# Patient Record
Sex: Female | Born: 1970 | Race: White | Hispanic: No | Smoking: Never smoker
Health system: Southern US, Community
[De-identification: ages and names within clinical notes are randomized; demographics above are authoritative.]

## PROBLEM LIST (undated history)

## (undated) DIAGNOSIS — R519 Headache, unspecified: Secondary | ICD-10-CM

## (undated) DIAGNOSIS — I7300 Raynaud's syndrome without gangrene: Secondary | ICD-10-CM

## (undated) DIAGNOSIS — G43909 Migraine, unspecified, not intractable, without status migrainosus: Secondary | ICD-10-CM

## (undated) DIAGNOSIS — M353 Polymyalgia rheumatica: Secondary | ICD-10-CM

## (undated) DIAGNOSIS — G43709 Chronic migraine without aura, not intractable, without status migrainosus: Secondary | ICD-10-CM

## (undated) DIAGNOSIS — K219 Gastro-esophageal reflux disease without esophagitis: Secondary | ICD-10-CM

## (undated) DIAGNOSIS — I890 Lymphedema, not elsewhere classified: Secondary | ICD-10-CM

## (undated) DIAGNOSIS — R51 Headache: Secondary | ICD-10-CM

## (undated) DIAGNOSIS — K589 Irritable bowel syndrome without diarrhea: Secondary | ICD-10-CM

## (undated) DIAGNOSIS — C4490 Unspecified malignant neoplasm of skin, unspecified: Secondary | ICD-10-CM

## (undated) DIAGNOSIS — Z8659 Personal history of other mental and behavioral disorders: Secondary | ICD-10-CM

## (undated) DIAGNOSIS — J3089 Other allergic rhinitis: Secondary | ICD-10-CM

## (undated) DIAGNOSIS — F32A Depression, unspecified: Secondary | ICD-10-CM

## (undated) DIAGNOSIS — T8859XA Other complications of anesthesia, initial encounter: Secondary | ICD-10-CM

## (undated) DIAGNOSIS — N1831 Chronic kidney disease, stage 3a: Secondary | ICD-10-CM

## (undated) DIAGNOSIS — I10 Essential (primary) hypertension: Secondary | ICD-10-CM

## (undated) HISTORY — PX: NASAL SEPTUM SURGERY: SHX37

## (undated) HISTORY — PX: ARTHOSCOPIC ROTAOR CUFF REPAIR: SHX5002

## (undated) HISTORY — DX: Headache: R51

## (undated) HISTORY — DX: Headache, unspecified: R51.9

---

## 2007-02-01 HISTORY — PX: BREAST REDUCTION SURGERY: SHX8

## 2014-01-28 LAB — HM PAP SMEAR: HM PAP: NORMAL

## 2014-09-17 LAB — HM MAMMOGRAPHY

## 2015-08-07 ENCOUNTER — Other Ambulatory Visit: Payer: Self-pay | Admitting: Neurology

## 2015-08-07 ENCOUNTER — Ambulatory Visit (INDEPENDENT_AMBULATORY_CARE_PROVIDER_SITE_OTHER): Payer: Managed Care, Other (non HMO) | Admitting: Neurology

## 2015-08-07 ENCOUNTER — Encounter: Payer: Self-pay | Admitting: Neurology

## 2015-08-07 VITALS — BP 108/78 | HR 80 | Ht 63.0 in | Wt 166.2 lb

## 2015-08-07 DIAGNOSIS — G43009 Migraine without aura, not intractable, without status migrainosus: Secondary | ICD-10-CM | POA: Diagnosis not present

## 2015-08-07 DIAGNOSIS — G43909 Migraine, unspecified, not intractable, without status migrainosus: Secondary | ICD-10-CM | POA: Insufficient documentation

## 2015-08-07 DIAGNOSIS — Z8249 Family history of ischemic heart disease and other diseases of the circulatory system: Secondary | ICD-10-CM | POA: Diagnosis not present

## 2015-08-07 MED ORDER — TOPIRAMATE 100 MG PO TABS: 100.0000 mg | ORAL_TABLET | Freq: Two times a day (BID) | ORAL | Status: DC

## 2015-08-07 MED ORDER — ZOLMITRIPTAN 5 MG PO TABS: 5.0000 mg | ORAL_TABLET | ORAL | Status: DC | PRN

## 2015-08-07 NOTE — Patient Instructions (Signed)
I had a long discussion with the patient and her sister regarding her migraine headaches and discussed headache triggers and need to avoid them. She was advised to keep a strict headache diary. Continue to use Zomig 5 mg for symptomatic relief and to increase Topamax to 100 mg in the morning and 200 mg at night for headache prophylaxis. I discussed side effects with the patient and advised her to call me if needed. Check MRI scan of the brain and MRA of the brain given her family history of brain aneurysms. She was also advised to do regular neck stretching exercises and to increase participation in activities for stress laxation like regular exercise, meditation and yoga. She was advised to return for follow-up in 2 months or call earlier if necessary. Migraine Headache A migraine headache is an intense, throbbing pain on one or both sides of your head. A migraine can last for 30 minutes to several hours. CAUSES  The exact cause of a migraine headache is not always known. However, a migraine may be caused when nerves in the brain become irritated and release chemicals that cause inflammation. This causes pain. Certain things may also trigger migraines, such as:  Alcohol.  Smoking.  Stress.  Menstruation.  Aged cheeses.  Foods or drinks that contain nitrates, glutamate, aspartame, or tyramine.  Lack of sleep.  Chocolate.  Caffeine.  Hunger.  Physical exertion.  Fatigue.  Medicines used to treat chest pain (nitroglycerine), birth control pills, estrogen, and some blood pressure medicines. SIGNS AND SYMPTOMS  Pain on one or both sides of your head.  Pulsating or throbbing pain.  Severe pain that prevents daily activities.  Pain that is aggravated by any physical activity.  Nausea, vomiting, or both.  Dizziness.  Pain with exposure to bright lights, loud noises, or activity.  General sensitivity to bright lights, loud noises, or smells. Before you get a migraine, you may  get warning signs that a migraine is coming (aura). An aura may include:  Seeing flashing lights.  Seeing bright spots, halos, or zigzag lines.  Having tunnel vision or blurred vision.  Having feelings of numbness or tingling.  Having trouble talking.  Having muscle weakness. DIAGNOSIS  A migraine headache is often diagnosed based on:  Symptoms.  Physical exam.  A CT scan or MRI of your head. These imaging tests cannot diagnose migraines, but they can help rule out other causes of headaches. TREATMENT Medicines may be given for pain and nausea. Medicines can also be given to help prevent recurrent migraines.  HOME CARE INSTRUCTIONS  Only take over-the-counter or prescription medicines for pain or discomfort as directed by your health care provider. The use of long-term narcotics is not recommended.  Lie down in a dark, quiet room when you have a migraine.  Keep a journal to find out what may trigger your migraine headaches. For example, write down:  What you eat and drink.  How much sleep you get.  Any change to your diet or medicines.  Limit alcohol consumption.  Quit smoking if you smoke.  Get 7-9 hours of sleep, or as recommended by your health care provider.  Limit stress.  Keep lights dim if bright lights bother you and make your migraines worse. SEEK IMMEDIATE MEDICAL CARE IF:   Your migraine becomes severe.  You have a fever.  You have a stiff neck.  You have vision loss.  You have muscular weakness or loss of muscle control.  You start losing your balance or have  trouble walking.  You feel faint or pass out.  You have severe symptoms that are different from your first symptoms. MAKE SURE YOU:   Understand these instructions.  Will watch your condition.  Will get help right away if you are not doing well or get worse.   This information is not intended to replace advice given to you by your health care provider. Make sure you discuss any  questions you have with your health care provider.   Document Released: 01/17/2005 Document Revised: 02/07/2014 Document Reviewed: 09/24/2012 Elsevier Interactive Patient Education [employer].

## 2015-08-07 NOTE — Progress Notes (Signed)
[facility] [address] [place]. [place] [address] [phone]       OFFICE CONSULT NOTE  Maria. Maria Mejia Date of Birth:  [birth year] Medical Record Number:  [id number]   Referring [id number] [place], [place], [place]  Reason for Referral:  migraines HPI: Maria Mejia is a 23 year Caucasian lady who's been having migraine headaches off and on since the last 20-30 years. She recently moved to [facility] last November from [place], [place]. She was being seen by her primary care physician mostly for migraines. She describes the headaches as being stereotypic starting with pressure behind the right eye and spreading to involve the whole head and occasionally even going to involve the back of the neck. Headaches are moderate to severe in intensity. If she takes Zomig quickly the headache can be controlled but may return the next day. The headache can last up to 24-48 hours and is a complaint bad nausea light and sound sensitivity. Headache triggers are identified as change in barometric pressure, artificial sweeteners and peanut oil. She also reports tightness and pain in the back of the neck often. She previously tried Imitrex which did not work and Maxalt was working but her insurance has been covering Zomig ODT which seems to work quite well. She has been on Topamax 100 mg twice daily for several years for headache prophylaxis. The current headache frequency is a couple of times a week. She has never been on Inderal, Elavil of Botox for headache prophylaxis. She has seen a neurologist only once in 2007. She does not remember having had any brain MRI done even though she does have a family history of her mother dying from ruptured brain aneurysm. There is family history of migraine in her mother and maternal uncle. The last couple weeks she's also complaining of her eyes jumping subjectively when she is spending a lot of time on the computers. She denies any double vision, blurred vision or  loss of vision. She also complains of some intermittent pressure in his right ear off and on as well as some transient positional dizziness when she looks down. She does go to a chiropractor once a month and finds relief in her neck tightness.  ROS:   14 system review of systems is positive for  blurred vision, headache, dizziness, eye movements, spinning sensation, ringing in the ears and all other systems negative PMH:  Past Medical History  Diagnosis Date  . Headache     Social History:  Social History   Social History  . Marital Status: Divorced    Spouse Name: N/A  . Number of Children: N/A  . Years of Education: N/A   Occupational History  . Not on file.   Social History Main Topics  . Smoking status: Never Smoker   . Smokeless tobacco: Not on file  . Alcohol Use: 0.6 oz/week    1 Shots of liquor per week     Comment: occcasioally  . Drug Use: Not on file  . Sexual Activity: Not on file   Other Topics Concern  . Not on file   Social History Narrative  . No narrative on file    Medications:   No current outpatient prescriptions on file prior to visit.   No current facility-administered medications on file prior to visit.    Allergies:   Allergies  Allergen Reactions  . Augmentin [Amoxicillin-Pot Clavulanate]     Upset stomach  . Erythromycin Other (See Comments)    Upset stomach  .  Omnicef [Cefdinir]     Upset stomach   . Zithromax [Azithromycin]     Physical Exam General: well developed, well nourished middle aged caucasian lady, seated, in no evident distress Head: head normocephalic and atraumatic.   Neck: supple with no carotid or supraclavicular bruits Cardiovascular: regular rate and rhythm, no murmurs Musculoskeletal: no deformity Skin:  no rash/petichiae Vascular:  Normal pulses all extremities  Neurologic Exam Mental Status: Awake and fully alert. Oriented to place and time. Recent and remote memory intact. Attention span, concentration  and fund of knowledge appropriate. Mood and affect appropriate.  Cranial Nerves: Fundoscopic exam reveals sharp disc margins. Pupils equal, briskly reactive to light. Extraocular movements full without nystagmus. Visual fields full to confrontation. Hearing intact. Facial sensation intact. Face, tongue, palate moves normally and symmetrically.  Motor: Normal bulk and tone. Normal strength in all tested extremity muscles. Sensory.: intact to touch , pinprick , position and vibratory sensation.  Coordination: Rapid alternating movements normal in all extremities. Finger-to-nose and heel-to-shin performed accurately bilaterally. Gait and Station: Arises from chair without difficulty. Stance is normal. Gait demonstrates normal stride length and balance . Able to heel, toe and tandem walk without difficulty.  Reflexes: 1+ and symmetric. Toes downgoing.  Headache Impact test score 65     ASSESSMENT: 70 year Caucasian lady with long-standing history of migraine headaches and family history of intracranial ruptured brain aneurysm    PLAN: I had a long discussion with the patient and her sister regarding her migraine headaches and discussed headache triggers and need to avoid them. She was advised to keep a strict headache diary. Continue to use Zomig 5 mg for symptomatic relief and to increase Topamax to 100 mg in the morning and 200 mg at night for headache prophylaxis. I discussed side effects with the patient and advised her to call me if needed. Check MRI scan of the brain and MRA of the brain given her family history of brain aneurysms. She was also advised to do regular neck stretching exercises and to increase participation in activities for stress laxation like regular exercise, meditation and yoga. She was advised to return for follow-up in 2 months or call earlier if necessary. Greater than 50% time during this 45 minute consultation was spent in counseling and coordination of care about migraine,  prevention and treatment Antony Contras, MD  [facility] [address] [place], [place] [address]  Phone [phone] Fax [phone] Note: This document was prepared with digital dictation and possible smart phrase technology. Any transcriptional errors that result from this process are unintentional.

## 2015-08-18 ENCOUNTER — Other Ambulatory Visit: Payer: Self-pay | Admitting: Neurology

## 2015-08-20 ENCOUNTER — Telehealth: Payer: Self-pay | Admitting: Neurology

## 2015-08-20 ENCOUNTER — Other Ambulatory Visit: Payer: Self-pay

## 2015-08-20 ENCOUNTER — Ambulatory Visit
Admission: RE | Admit: 2015-08-20 | Discharge: 2015-08-20 | Disposition: A | Discharge status: Home / Self Care | Payer: Managed Care, Other (non HMO) | Source: Ambulatory Visit | Attending: Neurology | Admitting: Neurology

## 2015-08-20 DIAGNOSIS — G43009 Migraine without aura, not intractable, without status migrainosus: Secondary | ICD-10-CM | POA: Diagnosis not present

## 2015-08-20 DIAGNOSIS — Z8249 Family history of ischemic heart disease and other diseases of the circulatory system: Secondary | ICD-10-CM

## 2015-08-20 MED ORDER — GADOBENATE DIMEGLUMINE 529 MG/ML IV SOLN: 15.0000 mL | Freq: Once | INTRAVENOUS | Status: DC | PRN

## 2015-08-20 NOTE — Telephone Encounter (Signed)
Patient is scheduled today for MRI and MRA. [facility] has denied the MRA and [facility] would like to know if you would like for the MRA to be canceled or if you would like to conduct a Peer to Peer before the patient's apt this afternoon. Please let me know how to proceed.

## 2015-08-21 ENCOUNTER — Other Ambulatory Visit: Payer: Self-pay | Admitting: Neurology

## 2015-08-21 DIAGNOSIS — I671 Cerebral aneurysm, nonruptured: Secondary | ICD-10-CM

## 2015-08-21 NOTE — Telephone Encounter (Signed)
Message sent to Dr. Sethi. 

## 2015-08-21 NOTE — Telephone Encounter (Signed)
Cancel mra

## 2015-09-10 ENCOUNTER — Encounter: Payer: Self-pay | Admitting: Primary Care

## 2015-09-10 ENCOUNTER — Other Ambulatory Visit: Payer: Self-pay | Admitting: Primary Care

## 2015-09-10 ENCOUNTER — Ambulatory Visit (INDEPENDENT_AMBULATORY_CARE_PROVIDER_SITE_OTHER): Payer: Managed Care, Other (non HMO) | Admitting: Primary Care

## 2015-09-10 DIAGNOSIS — F329 Major depressive disorder, single episode, unspecified: Secondary | ICD-10-CM

## 2015-09-10 DIAGNOSIS — H938X3 Other specified disorders of ear, bilateral: Secondary | ICD-10-CM

## 2015-09-10 DIAGNOSIS — Z8249 Family history of ischemic heart disease and other diseases of the circulatory system: Secondary | ICD-10-CM | POA: Diagnosis not present

## 2015-09-10 DIAGNOSIS — F32A Depression, unspecified: Secondary | ICD-10-CM | POA: Insufficient documentation

## 2015-09-10 DIAGNOSIS — F418 Other specified anxiety disorders: Secondary | ICD-10-CM | POA: Diagnosis not present

## 2015-09-10 DIAGNOSIS — F419 Anxiety disorder, unspecified: Principal | ICD-10-CM

## 2015-09-10 DIAGNOSIS — H938X9 Other specified disorders of ear, unspecified ear: Secondary | ICD-10-CM | POA: Insufficient documentation

## 2015-09-10 DIAGNOSIS — G43009 Migraine without aura, not intractable, without status migrainosus: Secondary | ICD-10-CM

## 2015-09-10 MED ORDER — BUPROPION HCL ER (XL) 300 MG PO TB24: 300.0000 mg | ORAL_TABLET | Freq: Every day | ORAL | 2 refills | Status: DC

## 2015-09-10 NOTE — Progress Notes (Signed)
Pre visit review using our clinic review tool, if applicable. No additional management support is needed unless otherwise documented below in the visit note. 

## 2015-09-10 NOTE — Assessment & Plan Note (Addendum)
Ongoing for years and currently managed by neurology. Currently taking Topamax 100 mg twice daily and so made ODT tablets as needed. Recent MRI slightly abnormal, CT scan pending. Family history of brain aneurysm in mother.

## 2015-09-10 NOTE — Patient Instructions (Addendum)
I recommend moving forward with the CT scan of your brain for further evaluation of aneurysm.  Start a daily antihistamine such as Claritin, Zyrtec, or Allegra. Take this once daily for 4 weeks.  Try using Flonase (fluticasone) nasal spray. Instill 2 sprays in each nostril once daily for ear pressure/fullness. Do this for at least 4 weeks.  Please call me if no improvement in ear/eye pressure.  Please call me with the strength of your Wellbutrin. Please also note if it says SR or XL on the end.  Please schedule a physical with me before the end of the year. You may also schedule a lab only appointment 3-4 days prior. We will discuss your lab results in detail during your physical.  It was a pleasure to meet you today! Please don't hesitate to call me with any questions. Welcome to [facility]!

## 2015-09-10 NOTE — Assessment & Plan Note (Signed)
Located to bilateral ears, also popping sensation behind both eyes. History of sinusitis and seasonal allergies. Temporary improvement with over-the-counter decongestant.  Will have her start daily antihistamine for [redacted] weeks along with Flonase for 4 weeks and report back if no improvement. HENT exam today unremarkable.

## 2015-09-10 NOTE — Progress Notes (Signed)
Subjective:    Patient ID: Maria Mejia, female    DOB: 05-02-1970, 45 y.o.   MRN: [id number]  HPI  Maria Mejia is a 45 year old female who presents today to establish care and discuss the problems mentioned below. Will obtain old records.  1) Migraines: Currently averaging 2-3 migraines weekly. Family history of brain aneurysm in her mother. Currently managed on Topamax 100 mg in the morning and 100 mg at night; and Zomig 5 mg ODT PRN. She is currently following with neurology and recently completed MRI which was mostly clear with a few non specific right frontal white matter hypreintensities. It was recommended she undergo a CT scan for further evaluation.   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 She would look at the computer and feel as though her eyes were moving back and forth. She's had complete eye exam recently which was unremarkable. She feels as though her eyes are popping forward now.   2) Ear Fullness: Several months ago experienced dizziness, ear pressure, and shooting pain. The pressure originated to her right ear and has since moved to her left ear. She's tried a decongestant for 1 week with improvement/resolve temporarily. Monday this week she's started feeling "off" as she's experiencing right ear pressure and the "popping" out of her eyes. She's not currently using OTC treatment for her symptoms at this time. History of recurrent sinusitis and sinus surgery in the 1990's.   3) Generalized Anxiety Disorder: Currently managed on Bupropion XL 150 mg shortly after she moved to this area in November 2016. She had recently dealt with  the passing of her mother and moving to a new place. She was placed on this medication for crying, anxiety, and depression. Over the past 2-3 months she's noticed tearing up at TV shows, watching her dog, and increased anxiety at work. She feels like the  medication is helpful overall but doesn't feel as though it's entirely effective.  Review of Systems  HENT: Negative for congestion and sinus pressure.        Ear fullness  Eyes:       Pressure behind eyes  Respiratory: Negative for shortness of breath.   Cardiovascular: Negative for chest pain.  Allergic/Immunologic: Positive for environmental allergies.  Neurological: Positive for headaches. Negative for dizziness.  Psychiatric/Behavioral: The patient is nervous/anxious.        See history of present illness       Past Medical History:  Diagnosis Date  . Headache      Social History   Social History  . Marital status: Divorced    Spouse name: N/A  . Number of children: N/A  . Years of education: N/A   Occupational History  . Not on file.   Social History Main Topics  . Smoking status: Never Smoker  . Smokeless tobacco: Not on file  . Alcohol use 0.6 oz/week    1 Shots of liquor per week     Comment: occcasioally  . Drug use: Unknown  . Sexual activity: Not on file   Other Topics Concern  . Not on file   Social History Narrative  . No narrative on file    No past surgical history on file.  Family History  Problem Relation Age of Onset  . Migraines Mother   . Stroke Mother   . Migraines Paternal Uncle   . Alzheimer's disease Maternal Grandfather   .  Alzheimer's disease Paternal Grandfather     Allergies  Allergen Reactions  . Augmentin [Amoxicillin-Pot Clavulanate]     Upset stomach  . Erythromycin Other (See Comments)    Upset stomach  . Omnicef [Cefdinir]     Upset stomach   . Zithromax [Azithromycin]     Current Outpatient Prescriptions on File Prior to Visit  Medication Sig Dispense Refill  . Levonorgestrel-Ethinyl Estradiol (SEASONIQUE) 0.15-0.03 &0.01 MG tablet Take 1 tablet by mouth daily.    Marland Kitchen topiramate (TOPAMAX) 100 MG tablet Take 1 tablet (100 mg total) by mouth 2 (two) times daily. Take one tablet in morning and two at night  (Patient taking differently: Take 100 mg by mouth 2 (two) times daily. ) 90 tablet 3   No current facility-administered medications on file prior to visit.     BP 114/72   Pulse 80   Temp 98.6 F (37 C) (Oral)   Ht 5' 3.75" (1.619 m)   Wt 162 lb 12.8 oz (73.8 kg)   SpO2 98%   BMI 28.16 kg/m    Objective:   Physical Exam  Constitutional: She appears well-nourished.  HENT:  Right Ear: Tympanic membrane and ear canal normal.  Left Ear: Tympanic membrane and ear canal normal.  Nose: Right sinus exhibits no maxillary sinus tenderness and no frontal sinus tenderness. Left sinus exhibits no maxillary sinus tenderness and no frontal sinus tenderness.  Mouth/Throat: Oropharynx is clear and moist.  Eyes: Conjunctivae and EOM are normal. Pupils are equal, round, and reactive to light.  Neck: Neck supple.  Cardiovascular: Normal rate and regular rhythm.   Pulmonary/Chest: Effort normal and breath sounds normal. She has no wheezes. She has no rales.  Lymphadenopathy:    She has no cervical adenopathy.  Neurological: No cranial nerve deficit.  Skin: Skin is warm and dry.  Psychiatric: She has a normal mood and affect.          Assessment & Plan:

## 2015-09-10 NOTE — Assessment & Plan Note (Signed)
Diagnosed in November 2016 after moving to [place] from [place] and also after the passing of her mother. Currently managed on Wellbutrin but she is unsure of the dose. Her chart states 75 mg once daily, pharmacy has XL 150 mg. She will call back when she gets home to read off the strength on her current bottle. We'll likely need to increase the dose as symptoms are not well managed at this time. Denies SI/HI.

## 2015-09-10 NOTE — Assessment & Plan Note (Signed)
In mother. Recent MRI slightly abnormal, CT head pending.

## 2015-09-15 ENCOUNTER — Encounter: Payer: Self-pay | Admitting: Primary Care

## 2015-09-16 ENCOUNTER — Other Ambulatory Visit: Payer: Self-pay | Admitting: Primary Care

## 2015-09-16 DIAGNOSIS — F329 Major depressive disorder, single episode, unspecified: Secondary | ICD-10-CM

## 2015-09-16 DIAGNOSIS — F419 Anxiety disorder, unspecified: Principal | ICD-10-CM

## 2015-09-16 MED ORDER — BUPROPION HCL ER (XL) 150 MG PO TB24: 150.0000 mg | ORAL_TABLET | Freq: Every day | ORAL | 3 refills | Status: DC

## 2015-09-18 ENCOUNTER — Ambulatory Visit
Admission: RE | Admit: 2015-09-18 | Discharge: 2015-09-18 | Disposition: A | Discharge status: Home / Self Care | Payer: Managed Care, Other (non HMO) | Source: Ambulatory Visit | Attending: Neurology | Admitting: Neurology

## 2015-09-18 DIAGNOSIS — I671 Cerebral aneurysm, nonruptured: Secondary | ICD-10-CM

## 2015-09-18 MED ORDER — IOPAMIDOL (ISOVUE-370) INJECTION 76%
80.0000 mL | Freq: Once | INTRAVENOUS | Status: AC | PRN
  Administered 2015-09-18: 80 mL via INTRAVENOUS

## 2015-09-29 ENCOUNTER — Other Ambulatory Visit: Payer: Self-pay | Admitting: Primary Care

## 2015-09-29 DIAGNOSIS — Z0000 Encounter for general adult medical examination without abnormal findings: Secondary | ICD-10-CM

## 2015-10-07 ENCOUNTER — Other Ambulatory Visit (INDEPENDENT_AMBULATORY_CARE_PROVIDER_SITE_OTHER): Payer: Managed Care, Other (non HMO)

## 2015-10-07 DIAGNOSIS — Z0000 Encounter for general adult medical examination without abnormal findings: Secondary | ICD-10-CM

## 2015-10-07 DIAGNOSIS — R7989 Other specified abnormal findings of blood chemistry: Secondary | ICD-10-CM

## 2015-10-08 LAB — COMPREHENSIVE METABOLIC PANEL
ALBUMIN: 3.9 g/dL (ref 3.5–5.2)
ALT: 18 U/L (ref 0–35)
AST: 18 U/L (ref 0–37)
Alkaline Phosphatase: 44 U/L (ref 39–117)
BUN: 24 mg/dL — AB (ref 6–23)
CHLORIDE: 108 meq/L (ref 96–112)
CO2: 25 meq/L (ref 19–32)
CREATININE: 1.02 mg/dL (ref 0.40–1.20)
Calcium: 8.6 mg/dL (ref 8.4–10.5)
GFR: 62.35 mL/min (ref >60.00)
Glucose, Bld: 71 mg/dL (ref 70–99)
POTASSIUM: 3.7 meq/L (ref 3.5–5.1)
SODIUM: 140 meq/L (ref 135–145)
Total Bilirubin: 0.2 mg/dL (ref 0.2–1.2)
Total Protein: 7.1 g/dL (ref 6.0–8.3)

## 2015-10-08 LAB — LIPID PANEL
CHOL/HDL RATIO: 4
CHOLESTEROL: 189 mg/dL (ref 0–200)
HDL: 49 mg/dL (ref >39.00)
NonHDL: 140.17
Triglycerides: 217 mg/dL — ABNORMAL HIGH (ref 0.0–149.0)
VLDL: 43.4 mg/dL — AB (ref 0.0–40.0)

## 2015-10-08 LAB — VITAMIN D 25 HYDROXY (VIT D DEFICIENCY, FRACTURES): VITD: 43.16 ng/mL (ref 30.00–100.00)

## 2015-10-08 LAB — HEMOGLOBIN A1C: HEMOGLOBIN A1C: 5.4 % (ref 4.6–6.5)

## 2015-10-08 LAB — LDL CHOLESTEROL, DIRECT: Direct LDL: 117 mg/dL

## 2015-10-12 ENCOUNTER — Ambulatory Visit (INDEPENDENT_AMBULATORY_CARE_PROVIDER_SITE_OTHER): Payer: Managed Care, Other (non HMO) | Admitting: Primary Care

## 2015-10-12 ENCOUNTER — Encounter: Payer: Self-pay | Admitting: Primary Care

## 2015-10-12 ENCOUNTER — Ambulatory Visit: Payer: Managed Care, Other (non HMO) | Admitting: Neurology

## 2015-10-12 VITALS — BP 122/80 | HR 82 | Temp 98.4°F | Ht 64.0 in | Wt 171.8 lb

## 2015-10-12 DIAGNOSIS — H938X3 Other specified disorders of ear, bilateral: Secondary | ICD-10-CM | POA: Diagnosis not present

## 2015-10-12 DIAGNOSIS — Z8249 Family history of ischemic heart disease and other diseases of the circulatory system: Secondary | ICD-10-CM | POA: Diagnosis not present

## 2015-10-12 DIAGNOSIS — M25552 Pain in left hip: Secondary | ICD-10-CM

## 2015-10-12 DIAGNOSIS — Z23 Encounter for immunization: Secondary | ICD-10-CM | POA: Diagnosis not present

## 2015-10-12 DIAGNOSIS — F418 Other specified anxiety disorders: Secondary | ICD-10-CM | POA: Diagnosis not present

## 2015-10-12 DIAGNOSIS — R6889 Other general symptoms and signs: Secondary | ICD-10-CM

## 2015-10-12 DIAGNOSIS — F419 Anxiety disorder, unspecified: Secondary | ICD-10-CM

## 2015-10-12 DIAGNOSIS — Z0001 Encounter for general adult medical examination with abnormal findings: Secondary | ICD-10-CM | POA: Diagnosis not present

## 2015-10-12 DIAGNOSIS — M25559 Pain in unspecified hip: Secondary | ICD-10-CM | POA: Insufficient documentation

## 2015-10-12 DIAGNOSIS — F32A Depression, unspecified: Secondary | ICD-10-CM

## 2015-10-12 DIAGNOSIS — F329 Major depressive disorder, single episode, unspecified: Secondary | ICD-10-CM

## 2015-10-12 DIAGNOSIS — E781 Pure hyperglyceridemia: Secondary | ICD-10-CM

## 2015-10-12 NOTE — Assessment & Plan Note (Signed)
Improved with Flonase and antihistamine, continue same.

## 2015-10-12 NOTE — Assessment & Plan Note (Signed)
Stable on Wellbutrin XL 150 mg, continue same.

## 2015-10-12 NOTE — Assessment & Plan Note (Signed)
Tetanus due, provided today. Declines flu. Pap and mammogram due, follows with GYN. Discussed the importance of a healthy diet and regular exercise in order for weight loss and to reduce risk of other medical diseases. Exam unremarkable, except for decreased ROM to left hip. Labs with elevated triglycerides. Follow up in 1 year for repeat physical.

## 2015-10-12 NOTE — Progress Notes (Signed)
Pre visit review using our clinic review tool, if applicable. No additional management support is needed unless otherwise documented below in the visit note. 

## 2015-10-12 NOTE — Progress Notes (Signed)
Subjective:    Patient ID: Maria Mejia, female    DOB: [birth year], 46 y.o.   MRN: [id number]  HPI  Maria Mejia is a 45 year old female who presents today for complete physical.  Immunizations: -Tetanus: Unsure.  -Influenza: Declines   Diet: She endorses a fair diet. Breakfast: Protein shake Lunch: Sandwich Dinner: Meat, sandwich, pizza, frozen food Snacks: Occasionally, granola bar, yogurt Desserts: Occasionally Beverages: Water  Exercise: She exercises 3-4 times weekly. Eye exam: Completed in 2017. Dental exam: Completes annually. Pap Smear: Completed in 2015, normal.  Mammogram: Completed 1 year ago, due.   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 Over the past 3 months she's noticed symptoms of "catching", difficulty with ROM (roation), achy pain to the left hip. Her pain is worse with sedentary activity. She will take ibuprofen as needed. She trailed physical therapy in 2011 for her right labral tear without improvement. She did undergo joint injections with improvement. She is interested in seeing Sports Medicine for further evaluation. Denies numbness/tinlging, weakness.   Review of Systems  Constitutional: Negative for unexpected weight change.  HENT: Negative for rhinorrhea.   Respiratory: Negative for cough and shortness of breath.   Cardiovascular: Negative for chest pain.  Gastrointestinal: Negative for constipation and diarrhea.  Genitourinary: Negative for difficulty urinating.       No period in several years.   Musculoskeletal: Negative for arthralgias and myalgias.  Skin: Negative for rash.  Allergic/Immunologic: Negative for environmental allergies.  Neurological: Negative for dizziness, numbness and headaches.  Psychiatric/Behavioral: Negative for suicidal ideas. The patient is nervous/anxious.        Overall stable on Wellbutrin XL 150 mg.       Past Medical History:  Diagnosis Date  . Headache      Social History   Social History  .  Marital status: Divorced    Spouse name: N/A  . Number of children: N/A  . Years of education: N/A   Occupational History  . Not on file.   Social History Main Topics  . Smoking status: Never Smoker  . Smokeless tobacco: Not on file  . Alcohol use 0.6 oz/week    1 Shots of liquor per week     Comment: occcasioally  . Drug use: Unknown  . Sexual activity: Not on file   Other Topics Concern  . Not on file   Social History Narrative  . No narrative on file    No past surgical history on file.  Family History  Problem Relation Age of Onset  . Migraines Mother   . Stroke Mother   . Migraines Paternal Uncle   . Alzheimer's disease Maternal Grandfather   . Alzheimer's disease Paternal Grandfather     Allergies  Allergen Reactions  . Augmentin [Amoxicillin-Pot Clavulanate]     Upset stomach  . Erythromycin Other (See Comments)    Upset stomach  . Omnicef [Cefdinir]     Upset stomach   . Zithromax [Azithromycin]     Current Outpatient Prescriptions on File Prior to Visit  Medication Sig Dispense Refill  . buPROPion (WELLBUTRIN XL) 150 MG 24 hr tablet Take 1 tablet (150 mg total) by mouth daily. 90 tablet 3  . Levonorgestrel-Ethinyl Estradiol (SEASONIQUE) 0.15-0.03 &0.01 MG tablet Take 1 tablet by mouth daily.    Marland Kitchen topiramate (TOPAMAX) 100 MG tablet Take 1 tablet (100 mg total) by mouth 2 (two) times daily. Take one tablet in morning and two at night (Patient taking differently:  Take 100 mg by mouth 2 (two) times daily. ) 90 tablet 3  . zolmitriptan (ZOMIG-ZMT) 5 MG disintegrating tablet Take 5 mg by mouth as needed for migraine.     No current facility-administered medications on file prior to visit.     BP 122/80   Pulse 82   Temp 98.4 F (36.9 C) (Oral)   Ht 5\' 4"  (1.626 m)   Wt 171 lb 12.8 oz (77.9 kg)   SpO2 94%   BMI 29.49 kg/m    Objective:   Physical Exam  Constitutional: She is oriented to person, place, and time. She appears well-nourished.    HENT:  Right Ear: Tympanic membrane and ear canal normal.  Left Ear: Tympanic membrane and ear canal normal.  Nose: Nose normal.  Mouth/Throat: Oropharynx is clear and moist.  Eyes: Conjunctivae and EOM are normal. Pupils are equal, round, and reactive to light.  Neck: Neck supple. No thyromegaly present.  Cardiovascular: Normal rate and regular rhythm.   No murmur heard. Pulmonary/Chest: Effort normal and breath sounds normal. She has no rales.  Abdominal: Soft. Bowel sounds are normal. There is no tenderness.  Musculoskeletal:       Left hip: She exhibits decreased range of motion. She exhibits normal strength, no tenderness and no deformity.  Lymphadenopathy:    She has no cervical adenopathy.  Neurological: She is alert and oriented to person, place, and time. She has normal reflexes. No cranial nerve deficit.  Skin: Skin is warm and dry. No rash noted.  Psychiatric: She has a normal mood and affect.          Assessment & Plan:

## 2015-10-12 NOTE — Addendum Note (Signed)
Addended by: Jacqualin Combes on: 10/12/2015 12:07 PM   Modules accepted: Orders

## 2015-10-12 NOTE — Assessment & Plan Note (Signed)
Triglycerides at 217. Overall fair diet, discussed changes. Continue exercise. Start Fish Oil 1000 mg once daily. Will continue to monitor.

## 2015-10-12 NOTE — Patient Instructions (Signed)
Your triglycerides are slightly too high. Start Fish Oil 1000 mg once daily to help lower these levels. We will continue to monitor these in the future.  You were provided with a tetanus vaccination which will cover you for 10 years.  Please notify me if you need a referral to OB/GYN.  Schedule an appointment with Dr. Lorelei Pont for further evaluation of your hip.  Increase consumption of vegetables, fruit, whole grains. Continue exercising.  Follow up in 1 year for repeat physical or sooner if needed.  It was a pleasure to see you today!

## 2015-10-12 NOTE — Assessment & Plan Note (Signed)
Recent CT angio unremarkable. Discussed results with patient.

## 2015-10-12 NOTE — Assessment & Plan Note (Signed)
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 Same symptoms now to left.  Discussed treatment options and she is interested in seeing Dr. Lorelei Pont and will schedule. No improvement with PT in the past, likely needs MRI.

## 2015-10-14 ENCOUNTER — Ambulatory Visit (INDEPENDENT_AMBULATORY_CARE_PROVIDER_SITE_OTHER)
Admission: RE | Admit: 2015-10-14 | Discharge: 2015-10-14 | Disposition: A | Discharge status: Home / Self Care | Payer: Managed Care, Other (non HMO) | Source: Ambulatory Visit | Attending: Family Medicine | Admitting: Family Medicine

## 2015-10-14 ENCOUNTER — Ambulatory Visit (INDEPENDENT_AMBULATORY_CARE_PROVIDER_SITE_OTHER): Payer: Managed Care, Other (non HMO) | Admitting: Family Medicine

## 2015-10-14 ENCOUNTER — Encounter: Payer: Self-pay | Admitting: Family Medicine

## 2015-10-14 VITALS — BP 110/72 | HR 88 | Temp 99.0°F | Ht 64.0 in | Wt 171.8 lb

## 2015-10-14 DIAGNOSIS — M25561 Pain in right knee: Secondary | ICD-10-CM

## 2015-10-14 DIAGNOSIS — M25552 Pain in left hip: Secondary | ICD-10-CM | POA: Diagnosis not present

## 2015-10-14 NOTE — Progress Notes (Signed)
Pre visit review using our clinic review tool, if applicable. No additional management support is needed unless otherwise documented below in the visit note. 

## 2015-10-14 NOTE — Progress Notes (Signed)
Dr. Frederico Hamman T. Shar Paez, MD, [facility] [place], [address] Phone: [phone] Fax: [phone]  10/14/2015  Patient: Maria Mejia, MRN: [id number], DOB: [birth year], 45 y.o.  Primary Physician:  Sheral Flow, NP   Chief Complaint  Patient presents with  . Hip Pain    ?Labrel Tear Left Hip  . Knee Pain    Right   Subjective:   Maria Mejia is a 45 y.o. very pleasant female patient who presents with the following:  Left hip labral tear: hip ??  R labral tear. Does not bother it too much. She has a confirmed right-sided labral tear in her hip that was treated conservatively  In [place] with physical therapy and an intra-articular injection.  She has had intra-articular injection of the hip 2. XR and MR arthrogram.  Small tear. Had a cortisone injection, last 2 years.    Left hip. Ongoing pain x 3 months.  Working out at [employer].  Will ache just from moving and in and out of her chair.  Cannot sit [place] style.  No trauma or dislocation, and primary pain comes with internal range of motion at the hip.  R knee hurt to straighten it and hurt some to walk on it and then felt a little bit better and felt like a bubble under her knee cap. Ongoing for about 24 hours.     Past Medical History, Surgical History, Social History, Family History, Problem List, Medications, and Allergies have been reviewed and updated if relevant.  Patient Active Problem List   Diagnosis Date Noted  . Encounter for routine adult medical exam with abnormal findings 10/12/2015  . Hypertriglyceridemia 10/12/2015  . Hip pain 10/12/2015  . Anxiety and depression 09/10/2015  . Ear fullness 09/10/2015  . Migraine 08/07/2015  . FH: brain aneurysm 08/07/2015    Past Medical History:  Diagnosis Date  . Headache     No past surgical history on file.  Social History   Social History  . Marital status: Divorced    Spouse name: N/A    . Number of children: N/A  . Years of education: N/A   Occupational History  . Not on file.   Social History Main Topics  . Smoking status: Never Smoker  . Smokeless tobacco: Never Used  . Alcohol use 0.6 oz/week    1 Shots of liquor per week     Comment: occcasioally  . Drug use: Unknown  . Sexual activity: Not on file   Other Topics Concern  . Not on file   Social History Narrative  . No narrative on file    Family History  Problem Relation Age of Onset  . Migraines Mother   . Stroke Mother   . Migraines Paternal Uncle   . Alzheimer's disease Maternal Grandfather   . Alzheimer's disease Paternal Grandfather     Allergies  Allergen Reactions  . Augmentin [Amoxicillin-Pot Clavulanate]     Upset stomach  . Erythromycin Other (See Comments)    Upset stomach  . Omnicef [Cefdinir]     Upset stomach   . Zithromax [Azithromycin]     Medication list reviewed and updated in full in [place].  GEN: No fevers, chills. Nontoxic. Primarily MSK c/o today. MSK: Detailed in the HPI GI: tolerating PO intake without difficulty Neuro: No numbness, parasthesias, or tingling associated. Otherwise the pertinent positives of the ROS are noted above.   Objective:   BP 110/72  Pulse 88   Temp 99 F (37.2 C) (Oral)   Ht 5\' 4"  (1.626 m)   Wt 171 lb 12 oz (77.9 kg)   BMI 29.48 kg/m     GEN: WDWN, NAD, Non-toxic, Alert & Oriented x 3 HEENT: Atraumatic, Normocephalic.  Ears and Nose: No external deformity. EXTR: No clubbing/cyanosis/edema NEURO: Normal gait.  PSYCH: Normally interactive. Conversant. Not depressed or anxious appearing.  Calm demeanor.   Knee:  R Gait: Normal heel toe pattern ROM: 0 - 125 Effusion: neg Echymosis or edema: none Patellar tendon NT Painful PLICA: neg Patellar grind: negative Medial and lateral patellar facet loading: negative medial and lateral joint lines: mild medial joint space pain Mcmurray's neg Flexion-pinch neg Varus  and valgus stress: stable Lachman: neg Ant and Post drawer: neg Hip abduction, IR, ER: WNL Hip flexion str: 5/5 Hip abd: 5/5 Quad: 5/5 VMO atrophy:No Hamstring concentric and eccentric: 5/5    HIP EXAM: SIDE: LEFT ROM: Abduction, Flexion, Internal and External range of motion: abduction is normal, and terminal abduction causes mild pain.  With the hip flexed to 90, terminal external range of motion is preserved, and  Internal range of motion causes pain.  There is increased range of motion with  Both abduction and internal range of motion  Done at the same time. Pain with terminal IROM and EROM: IROM GTB: NT SLR: NEG Knees: No effusion FABER: NT REVERSE FABER: NT, neg Piriformis: NT at direct palpation Str: flexion: 5/5 abduction: 5/5 adduction: 5/5 Strength testing non-tender   Radiology: X-rays: AP Bilateral Weight-bearing, Weightbearing Lateral, Sunrise views Indication: knee pain Findings:  No significant joint space loss.  There is no fracture or dislocation.  There is a  Mild lateral tilt to the patella on sunrise view. Electronically Signed  By: Owens Loffler, MD On: 10/15/2015 6:05 AM   Xray, hip series Indication: pain Findings: No occult fracture or dislocation. No signficant osteoarthritic changes.  Electronically Signed  By: Owens Loffler, MD On: 10/15/2015 6:06 AM    Assessment and Plan:   Left hip pain - Plan: DG HIP UNILAT WITH PELVIS 2-3 VIEWS LEFT, MR Hip Left W Contrast, DG FLUORO GUIDED NEEDLE PLC ASPIRATION/INJECTION LOC  Right knee pain - Plan: DG Knee AP/LAT W/Sunrise Right  >25 minutes spent in face to face time with patient, >50% spent in counselling or coordination of care   3 months of symptoms with failure to improve with traditional conservative care and management and normal hip x-rays.  Positive FADIR testing is suggestive of acetabular labral tear.  Obtain an MR arthrogram of the left hip to better evaluate for potential labral tear or other  derangement of the hip.  Findings will dictate potential further intervention including operative management versus iinterventional injection of the hip  And further conservative trial.  Reassured patient regarding her knee.  I would do nothing further  Short of modifying activities.  Follow-up: No Follow-up on file.  Orders Placed This Encounter  Procedures  . DG HIP UNILAT WITH PELVIS 2-3 VIEWS LEFT  . DG Knee AP/LAT W/Sunrise Right  . MR Hip Left W Contrast  . DG FLUORO GUIDED NEEDLE PLC ASPIRATION/INJECTION LOC    Signed,  Arnitra Sokoloski T. Prakash Kimberling, MD   Patient's Medications  New Prescriptions   No medications on file  Previous Medications   BUPROPION (WELLBUTRIN XL) 150 MG 24 HR TABLET    Take 1 tablet (150 mg total) by mouth daily.   LEVONORGESTREL-ETHINYL ESTRADIOL (SEASONIQUE) 0.15-0.03 &0.01 MG TABLET  Take 1 tablet by mouth daily.   TOPIRAMATE (TOPAMAX) 100 MG TABLET    Take 1 tablet (100 mg total) by mouth 2 (two) times daily. Take one tablet in morning and two at night   ZOLMITRIPTAN (ZOMIG-ZMT) 5 MG DISINTEGRATING TABLET    Take 5 mg by mouth as needed for migraine.  Modified Medications   No medications on file  Discontinued Medications   No medications on file

## 2015-10-14 NOTE — Patient Instructions (Signed)

## 2015-10-22 ENCOUNTER — Telehealth: Payer: Self-pay

## 2015-10-22 NOTE — Telephone Encounter (Signed)
I spoke to patient and she is aware of results.  

## 2015-10-22 NOTE — Telephone Encounter (Signed)
-----   Message from Garvin Fila, MD sent at 10/22/2015  4:13 PM EDT ----- Mitchell Heir inform the patient with CT angiogram of the brain was normal and shows no stenosis, aneurysms.

## 2015-10-29 ENCOUNTER — Ambulatory Visit
Admission: RE | Admit: 2015-10-29 | Discharge: 2015-10-29 | Disposition: A | Discharge status: Home / Self Care | Payer: Managed Care, Other (non HMO) | Source: Ambulatory Visit | Attending: Family Medicine | Admitting: Family Medicine

## 2015-10-29 DIAGNOSIS — M25552 Pain in left hip: Secondary | ICD-10-CM

## 2015-10-29 MED ORDER — IOPAMIDOL (ISOVUE-M 200) INJECTION 41%
10.0000 mL | Freq: Once | INTRAMUSCULAR | Status: AC
  Administered 2015-10-29: 10 mL via INTRA_ARTICULAR

## 2015-11-05 ENCOUNTER — Encounter: Payer: Self-pay | Admitting: Family Medicine

## 2015-12-08 ENCOUNTER — Encounter: Payer: Self-pay | Admitting: Primary Care

## 2016-03-30 ENCOUNTER — Other Ambulatory Visit: Payer: Self-pay | Admitting: Primary Care

## 2016-03-30 DIAGNOSIS — Z3041 Encounter for surveillance of contraceptive pills: Secondary | ICD-10-CM

## 2016-03-30 NOTE — Telephone Encounter (Signed)
Ok to refill? Electronically refill request for topiramate (TOPAMAX) 100 MG tablet. Last prescribed on 08/07/2015.  Levonorgestrel-Ethinyl Estradiol (SEASONIQUE) 0.15-0.03 &0.01. Medication have not been prescribed by [facility] Kraft.  Last seen on 10/12/2015.

## 2016-03-30 NOTE — Telephone Encounter (Signed)
Is she still following with neurology for her migraines? They have historically refilled the Topamax, just wondering.

## 2016-04-01 NOTE — Telephone Encounter (Signed)
Message left for patient to return my call.  

## 2016-04-04 NOTE — Telephone Encounter (Signed)
Noted. Please double check the instructions, there are two directions on her current Rx. Please call and notify I will send refills once we clarify how often she's taking.

## 2016-04-04 NOTE — Telephone Encounter (Signed)
Message left for patient to return my call.  Sent patient a MyChart message as well.

## 2016-04-04 NOTE — Telephone Encounter (Signed)
Pt returned your call, she will ck MyChart msg.

## 2016-04-05 ENCOUNTER — Other Ambulatory Visit: Payer: Self-pay | Admitting: Primary Care

## 2016-04-05 DIAGNOSIS — G43701 Chronic migraine without aura, not intractable, with status migrainosus: Secondary | ICD-10-CM

## 2016-04-05 MED ORDER — TOPIRAMATE 100 MG PO TABS: 100.0000 mg | ORAL_TABLET | Freq: Two times a day (BID) | ORAL | 1 refills | Status: DC

## 2016-04-05 NOTE — Telephone Encounter (Signed)
Patient stated that the neurologist had it as shown but it was too much for patient.  Patient is currently taking 1 tablet BID.

## 2016-04-05 NOTE — Telephone Encounter (Signed)
Noted, Rx sent to pharmacy,

## 2016-04-25 ENCOUNTER — Other Ambulatory Visit: Payer: Self-pay | Admitting: Primary Care

## 2016-04-25 DIAGNOSIS — G43701 Chronic migraine without aura, not intractable, with status migrainosus: Secondary | ICD-10-CM

## 2016-04-25 NOTE — Telephone Encounter (Signed)
Ok to refill? Electronically refill request for zolmitriptan (ZOMIG-ZMT) 5 MG disintegrating tablet. Medication have not been prescribed by [facility] Kraft. Last seen on 09/10/2015.

## 2016-06-09 ENCOUNTER — Encounter: Payer: Self-pay | Admitting: Primary Care

## 2016-06-09 ENCOUNTER — Ambulatory Visit (INDEPENDENT_AMBULATORY_CARE_PROVIDER_SITE_OTHER): Payer: Managed Care, Other (non HMO) | Admitting: Primary Care

## 2016-06-09 VITALS — BP 126/72 | HR 88 | Temp 98.9°F | Wt 175.5 lb

## 2016-06-09 DIAGNOSIS — R197 Diarrhea, unspecified: Secondary | ICD-10-CM | POA: Diagnosis not present

## 2016-06-09 DIAGNOSIS — G43701 Chronic migraine without aura, not intractable, with status migrainosus: Secondary | ICD-10-CM

## 2016-06-09 DIAGNOSIS — L659 Nonscarring hair loss, unspecified: Secondary | ICD-10-CM

## 2016-06-09 LAB — COMPREHENSIVE METABOLIC PANEL
ALBUMIN: 4.1 g/dL (ref 3.5–5.2)
ALK PHOS: 37 U/L — AB (ref 39–117)
ALT: 12 U/L (ref 0–35)
AST: 13 U/L (ref 0–37)
BILIRUBIN TOTAL: 0.3 mg/dL (ref 0.2–1.2)
BUN: 21 mg/dL (ref 6–23)
CALCIUM: 9.2 mg/dL (ref 8.4–10.5)
CO2: 24 mEq/L (ref 19–32)
CREATININE: 1.14 mg/dL (ref 0.40–1.20)
Chloride: 108 mEq/L (ref 96–112)
GFR: 54.68 mL/min — AB (ref >60.00)
Glucose, Bld: 86 mg/dL (ref 70–99)
Potassium: 4.3 mEq/L (ref 3.5–5.1)
Sodium: 138 mEq/L (ref 135–145)
TOTAL PROTEIN: 6.9 g/dL (ref 6.0–8.3)

## 2016-06-09 LAB — CBC WITH DIFFERENTIAL/PLATELET
BASOS ABS: 0.1 10*3/uL (ref 0.0–0.1)
Basophils Relative: 0.8 % (ref 0.0–3.0)
Eosinophils Absolute: 0.1 10*3/uL (ref 0.0–0.7)
Eosinophils Relative: 1.3 % (ref 0.0–5.0)
HEMATOCRIT: 42.7 % (ref 36.0–46.0)
HEMOGLOBIN: 14.4 g/dL (ref 12.0–15.0)
LYMPHS PCT: 34.1 % (ref 12.0–46.0)
Lymphs Abs: 2.5 10*3/uL (ref 0.7–4.0)
MCHC: 33.6 g/dL (ref 30.0–36.0)
MCV: 96.9 fl (ref 78.0–100.0)
MONOS PCT: 8.1 % (ref 3.0–12.0)
Monocytes Absolute: 0.6 10*3/uL (ref 0.1–1.0)
NEUTROS ABS: 4 10*3/uL (ref 1.4–7.7)
Neutrophils Relative %: 55.7 % (ref 43.0–77.0)
PLATELETS: 277 10*3/uL (ref 150.0–400.0)
RBC: 4.41 Mil/uL (ref 3.87–5.11)
RDW: 13 % (ref 11.5–15.5)
WBC: 7.2 10*3/uL (ref 4.0–10.5)

## 2016-06-09 LAB — TSH: TSH: 0.86 u[IU]/mL (ref 0.35–4.50)

## 2016-06-09 MED ORDER — DICYCLOMINE HCL 10 MG PO CAPS: ORAL_CAPSULE | ORAL | 0 refills | Status: DC

## 2016-06-09 NOTE — Assessment & Plan Note (Addendum)
Intermittent x 2-3 months. Sounds to be IBS etiology, but will check labs today to rule out any other cause.  Rx for dicyclomine provided to use PRN. Discussed stress reduction. Consider GI referral if no improvement. No alarm signs.

## 2016-06-09 NOTE — Progress Notes (Signed)
Pre visit review using our clinic review tool, if applicable. No additional management support is needed unless otherwise documented below in the visit note. 

## 2016-06-09 NOTE — Progress Notes (Signed)
Subjective:    Patient ID: Maria Mejia, female    DOB: [birth year], 46 y.o.   MRN: [id number]  HPI  Maria Mejia is a 46 year old female who presents today with multiple complaints.   1) Weight Gain: She's experiencing symptoms of weight gain and hair loss. She's very discouraged regarding her weight gain over the last 1-2 years. For the past 3-4 months she's not been exercising or strictly watching her diet, but denies over eating. She started working out at [employer] with a Physiological scientist over the past 5 weeks. She's also taking spin classes. She's discouraged as she's only lost 3-4 pounds in the past 4 weeks. She would like her thyroid tested to ensure this isn't the cause of her slow progress. Over the past 3-4 months she was very stressed with family illnesses and did suffer with depression, this has all improved. She has also noticed some hair loss since she's growing out her hair. She has no family history of thyroid disease.  Wt Readings from Last 3 Encounters:  06/09/16 175 lb 8 oz (79.6 kg)  10/14/15 171 lb 12 oz (77.9 kg)  10/12/15 171 lb 12.8 oz (77.9 kg)     2) Migraines: Evaluated by Neurology last year. Currently managed on Topamax 100 mg BID and Zomig PRN. She will experience six migraines monthly on average, sometimes more. She is running through her Zomig quickly. She is requesting an appeal to her insurance in order request more than 6 tablets monthly. Her first and only visit with the neurologist last year was very expensive.  3) Diarrhea: Intermittently present for the past 2 months. Occurs with gas, bloating. No relation to eating/drinking. She's not changed her diet. Denies nausea, vomiting, abdominal pain, rectal bleeding. This will occur once every 3-4 weeks and will last 3-4 hours. She'll take Imodium as needed with improvement.   Review of Systems  Constitutional: Negative for fever.  Eyes: Positive for photophobia.  Gastrointestinal: Positive for diarrhea. Negative for  abdominal pain, blood in stool, constipation, nausea and vomiting.  Skin:       Mild hair loss  Neurological: Positive for headaches.       Past Medical History:  Diagnosis Date  . Headache      Social History   Social History  . Marital status: Divorced    Spouse name: N/A  . Number of children: N/A  . Years of education: N/A   Occupational History  . Not on file.   Social History Main Topics  . Smoking status: Never Smoker  . Smokeless tobacco: Never Used  . Alcohol use 0.6 oz/week    1 Shots of liquor per week     Comment: occcasioally  . Drug use: Unknown  . Sexual activity: Not on file   Other Topics Concern  . Not on file   Social History Narrative  . No narrative on file    No past surgical history on file.  Family History  Problem Relation Age of Onset  . Migraines Mother   . Stroke Mother   . Migraines Paternal Uncle   . Alzheimer's disease Maternal Grandfather   . Alzheimer's disease Paternal Grandfather     Allergies  Allergen Reactions  . Augmentin [Amoxicillin-Pot Clavulanate]     Upset stomach  . Erythromycin Other (See Comments)    Upset stomach  . Omnicef [Cefdinir]     Upset stomach   . Zithromax [Azithromycin]     Current Outpatient Prescriptions on File  Prior to Visit  Medication Sig Dispense Refill  . buPROPion (WELLBUTRIN XL) 150 MG 24 hr tablet Take 1 tablet (150 mg total) by mouth daily. 90 tablet 3  . Levonorgestrel-Ethinyl Estradiol (AMETHIA,CAMRESE) 0.15-0.03 &0.01 MG tablet TAKE 1 TABLET BY MOUTH ONCE DAILY 91 tablet 1  . topiramate (TOPAMAX) 100 MG tablet Take 1 tablet (100 mg total) by mouth 2 (two) times daily. 180 tablet 1  . zolmitriptan (ZOMIG-ZMT) 5 MG disintegrating tablet Take 1 tablet by mouth at migraine onset. May repeat in 2 hours if migraine persists. Do not exceed 2 tablets in 24 hours. 9 tablet 4   No current facility-administered medications on file prior to visit.     BP 126/72   Pulse 88   Temp  98.9 F (37.2 C) (Oral)   Wt 175 lb 8 oz (79.6 kg)   SpO2 98%   BMI 30.12 kg/m    Objective:   Physical Exam  Constitutional: She appears well-nourished.  Eyes: EOM are normal. Pupils are equal, round, and reactive to light.  Neck: Neck supple.  Cardiovascular: Normal rate and regular rhythm.   Pulmonary/Chest: Effort normal and breath sounds normal.  Neurological: No cranial nerve deficit.  Skin: Skin is warm and dry.  Psychiatric: She has a normal mood and affect.          Assessment & Plan:  Weight Gain:  Likely from poor diet and lack of exercise from prior months. Commended her on her weight loss within 5 weeks, discussed that this was a positive thing. Will check TSH given hair loss, suspect to be normal.. Encouraged her to continue regular exercise, healthy diet, and stress reduction.  Sheral Flow, NP

## 2016-06-09 NOTE — Assessment & Plan Note (Signed)
Uncontrolled. Using Zomig sometimes 6 times monthly. Long discussion today regarding treatment options. She declines other preventative treatment options. Will work on appeal for Zomig, but discussed that she will need Neurology evaluation for prevention. Referral placed to neurology.

## 2016-06-09 NOTE — Patient Instructions (Addendum)
Complete lab work prior to leaving today. I will notify you of your results once received.   Stop by the front desk and speak with either Rosaria Ferries or Shirlean Mylar regarding your referral to Neurology. Discuss your insurance concerns.   You may take the dicyclomine 10 mg capsules three times daily as needed for diarrhea/bloating. Please update me if no improvement.  It was a pleasure to see you today!

## 2016-06-13 ENCOUNTER — Encounter: Payer: Self-pay | Admitting: Neurology

## 2016-06-22 ENCOUNTER — Ambulatory Visit: Payer: Managed Care, Other (non HMO) | Admitting: Primary Care

## 2016-06-28 ENCOUNTER — Other Ambulatory Visit: Payer: Self-pay | Admitting: Primary Care

## 2016-06-28 DIAGNOSIS — R197 Diarrhea, unspecified: Secondary | ICD-10-CM

## 2016-06-29 NOTE — Telephone Encounter (Signed)
Please call patient, any improvement in diarrhea since starting the dicyclomine? Does she need a refill?

## 2016-06-29 NOTE — Telephone Encounter (Signed)
Noted  

## 2016-06-29 NOTE — Telephone Encounter (Signed)
Ok to refill? Electronically refill request for dicyclomine (BENTYL) 10 MG capsule. Last prescribed on 06/09/2016. Last seen on 06/09/2016

## 2016-06-29 NOTE — Telephone Encounter (Signed)
Spoke to patient and was advised that she has only had to take one of the pills since the office visit. Patient stated that she only has an episode about once a month. Patient stated that she had a bubbly sensation in her stomach the other day and took one pill and did not have any diarrhea with that episode. Patient stated that she does not need a refill on the medication at this time and will let [facility] know when she does.

## 2016-07-07 ENCOUNTER — Other Ambulatory Visit: Payer: Self-pay | Admitting: Primary Care

## 2016-07-07 DIAGNOSIS — R197 Diarrhea, unspecified: Secondary | ICD-10-CM

## 2016-07-07 MED ORDER — DICYCLOMINE HCL 10 MG PO CAPS: ORAL_CAPSULE | ORAL | 0 refills | Status: DC

## 2016-07-12 ENCOUNTER — Telehealth: Payer: Self-pay | Admitting: Neurology

## 2016-07-12 NOTE — Telephone Encounter (Signed)
Please advise 

## 2016-07-12 NOTE — Telephone Encounter (Signed)
PT called and asked if she needed to come in to see Dr Tomi Likens on her scheduled July visit because she wanted to discuss botox and knows she will not qualify for botox and does not want to waste Dr Georgie Chard time and also it will be expensive, told her we could not advise but she wanted a message put through

## 2016-07-13 ENCOUNTER — Telehealth: Payer: Self-pay | Admitting: *Deleted

## 2016-07-13 NOTE — Telephone Encounter (Signed)
If there is something that she wishes to discuss with a neurologist regarding her migraines, then she should definitely keep the appointment.  A patient visit is never a waste of my time.  I am here to address her concerns and questions.

## 2016-07-13 NOTE — Telephone Encounter (Signed)
Patient's copay is too high.  She will get her refills from PCP.

## 2016-07-13 NOTE — Telephone Encounter (Signed)
Left message giving patient Dr. Georgie Chard message.

## 2016-07-31 ENCOUNTER — Encounter: Payer: Self-pay | Admitting: Primary Care

## 2016-07-31 DIAGNOSIS — G43701 Chronic migraine without aura, not intractable, with status migrainosus: Secondary | ICD-10-CM

## 2016-08-01 MED ORDER — ZOLMITRIPTAN 5 MG PO TBDP: ORAL_TABLET | ORAL | 2 refills | Status: DC

## 2016-08-04 NOTE — Telephone Encounter (Signed)
How many am I calling in??  #5 or #9??

## 2016-08-04 NOTE — Telephone Encounter (Signed)
Spoke with [employer].  Preferred quantity per 30 days is #6.  In order for the patient to get #9 a prior authorization will need to be done.  Do you want to do a PA or change Rx to quantity #6?

## 2016-08-04 NOTE — Telephone Encounter (Signed)
9 please! Thank you!

## 2016-08-04 NOTE — Telephone Encounter (Signed)
Donna, can you help?

## 2016-08-04 NOTE — Telephone Encounter (Signed)
Butch Penny, is this something you can do for Vallarie Mare since she and I are both out of the office? See the next My Chart message.  I appreciate it!

## 2016-08-04 NOTE — Telephone Encounter (Signed)
I'm fine with either. Please notify the patient of the situation and let her decide.

## 2016-08-05 NOTE — Telephone Encounter (Signed)
PA approved.  Patient notified via [place].

## 2016-08-05 NOTE — Telephone Encounter (Signed)
I have completed PA on CoverMyMeds.  Awaiting decision.

## 2016-08-05 NOTE — Telephone Encounter (Deleted)
Patient want PA.  Forms Requested.  This will have to wait for Kate's return to office on Monday to complete.  I think the problem is the patient use to be on regular Zomig 5 mg and then changed to an ODT tablets.  That is why insurance will approve only 6 vs 9.

## 2016-08-26 ENCOUNTER — Ambulatory Visit: Payer: Managed Care, Other (non HMO) | Admitting: Neurology

## 2016-09-09 ENCOUNTER — Other Ambulatory Visit: Payer: Self-pay | Admitting: Primary Care

## 2016-09-09 DIAGNOSIS — F329 Major depressive disorder, single episode, unspecified: Secondary | ICD-10-CM

## 2016-09-09 DIAGNOSIS — F419 Anxiety disorder, unspecified: Principal | ICD-10-CM

## 2016-10-07 ENCOUNTER — Other Ambulatory Visit: Payer: Self-pay | Admitting: Primary Care

## 2016-10-07 DIAGNOSIS — R197 Diarrhea, unspecified: Secondary | ICD-10-CM

## 2016-10-07 NOTE — Telephone Encounter (Signed)
Rx request for bentyl 10 mg Last refilled 07/07/2016 Last OV 06/09/2016 Next OV: none

## 2016-10-26 ENCOUNTER — Other Ambulatory Visit: Payer: Self-pay | Admitting: Primary Care

## 2016-10-26 DIAGNOSIS — Z3041 Encounter for surveillance of contraceptive pills: Secondary | ICD-10-CM

## 2016-11-22 ENCOUNTER — Other Ambulatory Visit: Payer: Self-pay | Admitting: Primary Care

## 2016-11-22 DIAGNOSIS — G43701 Chronic migraine without aura, not intractable, with status migrainosus: Secondary | ICD-10-CM

## 2016-12-08 ENCOUNTER — Other Ambulatory Visit: Payer: Self-pay | Admitting: Primary Care

## 2016-12-08 DIAGNOSIS — R197 Diarrhea, unspecified: Secondary | ICD-10-CM

## 2017-01-25 ENCOUNTER — Encounter: Payer: Self-pay | Admitting: Primary Care

## 2017-01-25 DIAGNOSIS — G43701 Chronic migraine without aura, not intractable, with status migrainosus: Secondary | ICD-10-CM

## 2017-01-25 DIAGNOSIS — Z1283 Encounter for screening for malignant neoplasm of skin: Secondary | ICD-10-CM

## 2017-01-25 DIAGNOSIS — Z3041 Encounter for surveillance of contraceptive pills: Secondary | ICD-10-CM

## 2017-01-25 MED ORDER — LEVONORGEST-ETH ESTRAD 91-DAY 0.15-0.03 &0.01 MG PO TABS: 1.0000 | ORAL_TABLET | Freq: Every day | ORAL | 0 refills | Status: DC

## 2017-02-01 MED ORDER — ZOLMITRIPTAN 5 MG PO TBDP: ORAL_TABLET | ORAL | 0 refills | Status: DC

## 2017-02-28 ENCOUNTER — Ambulatory Visit (INDEPENDENT_AMBULATORY_CARE_PROVIDER_SITE_OTHER): Payer: Managed Care, Other (non HMO) | Admitting: Primary Care

## 2017-02-28 ENCOUNTER — Encounter: Payer: Self-pay | Admitting: Primary Care

## 2017-02-28 VITALS — BP 124/74 | HR 79 | Temp 98.5°F | Ht 63.75 in | Wt 169.8 lb

## 2017-02-28 DIAGNOSIS — Z0001 Encounter for general adult medical examination with abnormal findings: Secondary | ICD-10-CM

## 2017-02-28 DIAGNOSIS — R197 Diarrhea, unspecified: Secondary | ICD-10-CM | POA: Diagnosis not present

## 2017-02-28 DIAGNOSIS — F32A Depression, unspecified: Secondary | ICD-10-CM

## 2017-02-28 DIAGNOSIS — F329 Major depressive disorder, single episode, unspecified: Secondary | ICD-10-CM

## 2017-02-28 DIAGNOSIS — G43009 Migraine without aura, not intractable, without status migrainosus: Secondary | ICD-10-CM

## 2017-02-28 DIAGNOSIS — F419 Anxiety disorder, unspecified: Secondary | ICD-10-CM | POA: Diagnosis not present

## 2017-02-28 DIAGNOSIS — E781 Pure hyperglyceridemia: Secondary | ICD-10-CM

## 2017-02-28 MED ORDER — METHYLPREDNISOLONE ACETATE 80 MG/ML IJ SUSP
80.0000 mg | Freq: Once | INTRAMUSCULAR | Status: AC
  Administered 2017-02-28: 80 mg via INTRAMUSCULAR

## 2017-02-28 NOTE — Assessment & Plan Note (Signed)
Repeat lipid panel pending. Recommended to continue regular exercise and start healthy diet.

## 2017-02-28 NOTE — Assessment & Plan Note (Signed)
Overall doing well on bupropion XL 150 mg, denies SI/HI. Continue same.

## 2017-02-28 NOTE — Patient Instructions (Signed)
Complete lab work at your GYN office if possible.  Continue exercising. You should be getting 150 minutes of moderate intensity exercise weekly.  Start working on [employer] as discussed.  You will be contacted regarding your [facility] to [facility].  Please let us know if you have not been contacted within one week.   Follow up in 1 year for your annual exam or sooner if needed.  It was a pleasure to see you today!   Preventive Care 40-64 Years, Female Preventive care refers to lifestyle choices and visits with your health care provider that can promote health and wellness. What does preventive care include?  A yearly physical exam. This is also called an annual well check.  Dental exams once or twice a year.  Routine eye exams. Ask your health care provider how often you should have your eyes checked.  Personal lifestyle choices, including: ? Daily care of your teeth and gums. ? Regular physical activity. ? Eating a healthy diet. ? Avoiding tobacco and drug use. ? Limiting alcohol use. ? Practicing safe sex. ? Taking low-dose aspirin daily starting at age 18. ? Taking vitamin and mineral supplements as recommended by your health care provider. What happens during an annual well check? The services and screenings done by your health care provider during your annual well check will depend on your age, overall health, lifestyle risk factors, and family history of disease. Counseling Your health care provider may ask you questions about your:  Alcohol use.  Tobacco use.  Drug use.  Emotional well-being.  Home and relationship well-being.  Sexual activity.  Eating habits.  Work and work Statistician.  Method of birth control.  Menstrual cycle.  Pregnancy history.  Screening You may have the following tests or measurements:  Height, weight, and BMI.  Blood pressure.  Lipid and cholesterol levels. These may be checked every 5 years, or more  frequently if you are over 56 years old.  Skin check.  Lung cancer screening. You may have this screening every year starting at age 24 if you have a 30-pack-year history of smoking and currently smoke or have quit within the past 15 years.  Fecal occult blood test (FOBT) of the stool. You may have this test every year starting at age 32.  Flexible sigmoidoscopy or colonoscopy. You may have a sigmoidoscopy every 5 years or a colonoscopy every 10 years starting at age 77.  Hepatitis C blood test.  Hepatitis B blood test.  Sexually transmitted disease (STD) testing.  Diabetes screening. This is done by checking your blood sugar (glucose) after you have not eaten for a while (fasting). You may have this done every 1-3 years.  Mammogram. This may be done every 1-2 years. Talk to your health care provider about when you should start having regular mammograms. This may depend on whether you have a family history of breast cancer.  BRCA-related cancer screening. This may be done if you have a family history of breast, ovarian, tubal, or peritoneal cancers.  Pelvic exam and Pap test. This may be done every 3 years starting at age 49. Starting at age 62, this may be done every 5 years if you have a Pap test in combination with an HPV test.  Bone density scan. This is done to screen for osteoporosis. You may have this scan if you are at high risk for osteoporosis.  Discuss your test results, treatment options, and if necessary, the need for more tests with your health  care provider. Vaccines Your health care provider may recommend certain vaccines, such as:  Influenza vaccine. This is recommended every year.  Tetanus, diphtheria, and acellular pertussis (Tdap, Td) vaccine. You may need a Td booster every 10 years.  Varicella vaccine. You may need this if you have not been vaccinated.  Zoster vaccine. You may need this after age 83.  Measles, mumps, and rubella (MMR) vaccine. You may need  at least one dose of MMR if you were born in 1957 or later. You may also need a second dose.  Pneumococcal 13-valent conjugate (PCV13) vaccine. You may need this if you have certain conditions and were not previously vaccinated.  Pneumococcal polysaccharide (PPSV23) vaccine. You may need one or two doses if you smoke cigarettes or if you have certain conditions.  Meningococcal vaccine. You may need this if you have certain conditions.  Hepatitis A vaccine. You may need this if you have certain conditions or if you travel or work in places where you may be exposed to hepatitis A.  Hepatitis B vaccine. You may need this if you have certain conditions or if you travel or work in places where you may be exposed to hepatitis B.  Haemophilus influenzae type b (Hib) vaccine. You may need this if you have certain conditions.  Talk to your health care provider about which screenings and vaccines you need and how often you need them. This information is not intended to replace advice given to you by your health care provider. Make sure you discuss any questions you have with your health care provider. Document Released: 02/13/2015 Document Revised: 10/07/2015 Document Reviewed: 11/18/2014 Elsevier Interactive Patient Education  [employer].

## 2017-02-28 NOTE — Assessment & Plan Note (Signed)
Using dicyclomine PRN before long trips, doing well. Continue same.

## 2017-02-28 NOTE — Addendum Note (Signed)
Addended by: Jacqualin Combes on: 02/28/2017 04:37 PM   Modules accepted: Orders

## 2017-02-28 NOTE — Progress Notes (Signed)
Subjective:    Patient ID: Maria Mejia, female    DOB: 1970/09/13, 47 y.o.   MRN: [id number]  HPI  Maria Mejia is a 47 year old female who presents today for complete physical.  Sinus pressure: Present for the past several days and located to the left frontal sinus. She's taken OTC sinus medication without much improvement. She's blowing clear mucous from her nasal cavity. She denies fevers.   Immunizations: -Tetanus: Completed in 2017 -Influenza: Declines   Diet: Recently ordered precooked meals. Breakfast: Protein shakes, eggs Lunch: Meat, vegetables, yogurt Dinner: Meat, vegetables, protein shake Snacks: None Desserts: 2-3 times weekly if available  Beverages: Water with flavor, protein shakes  Exercise: She recently started exercising 5-6 days weekly. Eye exam: Completed 1 month ago.  Dental exam: Follows semi-annually Pap Smear: Completed in 2015, following with GYN. UTD. Mammogram: Completed in January 2019   Review of Systems  Constitutional: Negative for fever and unexpected weight change.  HENT: Positive for sinus pressure. Negative for rhinorrhea.   Respiratory: Negative for cough and shortness of breath.   Cardiovascular: Negative for chest pain.  Gastrointestinal: Negative for constipation and diarrhea.  Genitourinary: Negative for difficulty urinating and menstrual problem.  Musculoskeletal: Negative for arthralgias and myalgias.  Skin: Negative for rash.  Allergic/Immunologic: Negative for environmental allergies.  Neurological: Positive for headaches. Negative for dizziness and numbness.       Past Medical History:  Diagnosis Date  . Headache      Social History   Socioeconomic History  . Marital status: Divorced    Spouse name: Not on file  . Number of children: Not on file  . Years of education: Not on file  . Highest education level: Not on file  Social Needs  . Financial resource strain: Not on file  . Food insecurity - worry: Not on file    . Food insecurity - inability: Not on file  . Transportation needs - medical: Not on file  . Transportation needs - non-medical: Not on file  Occupational History  . Not on file  Tobacco Use  . Smoking status: Never Smoker  . Smokeless tobacco: Never Used  Substance and Sexual Activity  . Alcohol use: Yes    Alcohol/week: 0.6 oz    Types: 1 Shots of liquor per week    Comment: occcasioally  . Drug use: Not on file  . Sexual activity: Not on file  Other Topics Concern  . Not on file  Social History Narrative  . Not on file     Family History  Problem Relation Age of Onset  . Migraines Mother   . Stroke Mother   . Migraines Paternal Uncle   . Alzheimer's disease Maternal Grandfather   . Alzheimer's disease Paternal Grandfather     Allergies  Allergen Reactions  . Augmentin [Amoxicillin-Pot Clavulanate]     Upset stomach  . Erythromycin Other (See Comments)    Upset stomach  . Omnicef [Cefdinir]     Upset stomach   . Zithromax [Azithromycin]     Current Outpatient Medications on File Prior to Visit  Medication Sig Dispense Refill  . buPROPion (WELLBUTRIN XL) 150 MG 24 hr tablet TAKE 1 TABLET (150 MG TOTAL) BY MOUTH DAILY. 90 tablet 1  . dicyclomine (BENTYL) 10 MG capsule TAKE 1 CAPSULE BY MOUTH THREE TIMES DAILY BEFORE MEALS AS NEEDED FOR IBS SYMPTOMS. 30 capsule 0  . topiramate (TOPAMAX) 100 MG tablet TAKE 1 TABLET BY MOUTH TWICE A DAY 180 tablet  1  . zolmitriptan (ZOMIG-ZMT) 5 MG disintegrating tablet Take 1 tablet by mouth at migraine onset. May repeat in 2 hours if migraine persists. Do not exceed 2 tablets in 24 hours. 9 tablet 0   No current facility-administered medications on file prior to visit.     BP 124/74   Pulse 79   Temp 98.5 F (36.9 C) (Oral)   Ht 5' 3.75" (1.619 m)   Wt 169 lb 12.8 oz (77 kg)   SpO2 98%   BMI 29.38 kg/m    Objective:   Physical Exam  Constitutional: She is oriented to person, place, and time. She appears  well-nourished. She does not appear ill.  HENT:  Right Ear: Tympanic membrane and ear canal normal.  Left Ear: Tympanic membrane and ear canal normal.  Nose: Left sinus exhibits maxillary sinus tenderness and frontal sinus tenderness.  Mouth/Throat: Oropharynx is clear and moist.  Eyes: Conjunctivae and EOM are normal. Pupils are equal, round, and reactive to light.  Neck: Neck supple. No thyromegaly present.  Cardiovascular: Normal rate and regular rhythm.  No murmur heard. Pulmonary/Chest: Effort normal and breath sounds normal. She has no rales.  Abdominal: Soft. Bowel sounds are normal. There is no tenderness.  Musculoskeletal: Normal range of motion.  Lymphadenopathy:    She has no cervical adenopathy.  Neurological: She is alert and oriented to person, place, and time. She has normal reflexes. No cranial nerve deficit.  Skin: Skin is warm and dry. No rash noted.  Psychiatric: She has a normal mood and affect.          Assessment & Plan:  Sinus Pressure:  Exam today consistent for viral sinusitis vs allergy involvement. Does not appear sickly, does appear uncomfortable. IM Depo Medrol 80 mg provided today. Continue OTC regimen.  Pleas Koch, NP

## 2017-02-28 NOTE — Assessment & Plan Note (Signed)
Using 9 tablets of Zomig monthly, compliant to topiramate 100 mg. She never attended her appointment with the Neurologist as placed in May 2018.  She remains uncontrolled, referral placed to [facility].

## 2017-02-28 NOTE — Assessment & Plan Note (Signed)
Td UTD, declines influenza vaccination. Pap and mammogram UTD, following with GYN. Discussed the importance of a healthy diet and regular exercise in order for weight loss, and to reduce the risk of any potential medical problems. Exam with sinus tenderness over left frontal sinus, otherwise unremarkable. Labs pending. Follow up in 1 year .

## 2017-03-16 ENCOUNTER — Other Ambulatory Visit: Payer: Self-pay | Admitting: Primary Care

## 2017-03-16 DIAGNOSIS — G43701 Chronic migraine without aura, not intractable, with status migrainosus: Secondary | ICD-10-CM

## 2017-03-16 DIAGNOSIS — F419 Anxiety disorder, unspecified: Principal | ICD-10-CM

## 2017-03-16 DIAGNOSIS — F329 Major depressive disorder, single episode, unspecified: Secondary | ICD-10-CM

## 2017-03-22 ENCOUNTER — Emergency Department
Admission: EM | Admit: 2017-03-22 | Discharge: 2017-03-22 | Disposition: A | Discharge status: Home / Self Care | Payer: Managed Care, Other (non HMO) | Attending: Emergency Medicine | Admitting: Emergency Medicine

## 2017-03-22 ENCOUNTER — Encounter: Payer: Self-pay | Admitting: Intensive Care

## 2017-03-22 ENCOUNTER — Ambulatory Visit: Payer: Self-pay | Admitting: *Deleted

## 2017-03-22 ENCOUNTER — Other Ambulatory Visit: Payer: Self-pay

## 2017-03-22 ENCOUNTER — Emergency Department: Payer: Managed Care, Other (non HMO)

## 2017-03-22 ENCOUNTER — Ambulatory Visit: Payer: Self-pay

## 2017-03-22 DIAGNOSIS — R1030 Lower abdominal pain, unspecified: Secondary | ICD-10-CM

## 2017-03-22 DIAGNOSIS — N760 Acute vaginitis: Secondary | ICD-10-CM | POA: Diagnosis not present

## 2017-03-22 DIAGNOSIS — R102 Pelvic and perineal pain: Secondary | ICD-10-CM

## 2017-03-22 DIAGNOSIS — B9689 Other specified bacterial agents as the cause of diseases classified elsewhere: Secondary | ICD-10-CM | POA: Insufficient documentation

## 2017-03-22 DIAGNOSIS — R8271 Bacteriuria: Secondary | ICD-10-CM | POA: Diagnosis not present

## 2017-03-22 DIAGNOSIS — R112 Nausea with vomiting, unspecified: Secondary | ICD-10-CM | POA: Diagnosis not present

## 2017-03-22 HISTORY — DX: Migraine, unspecified, not intractable, without status migrainosus: G43.909

## 2017-03-22 LAB — URINALYSIS, COMPLETE (UACMP) WITH MICROSCOPIC
Bilirubin Urine: NEGATIVE
Glucose, UA: NEGATIVE mg/dL
Ketones, ur: NEGATIVE mg/dL
Leukocytes, UA: NEGATIVE
NITRITE: NEGATIVE
Protein, ur: NEGATIVE mg/dL
SPECIFIC GRAVITY, URINE: 1.021 (ref 1.005–1.030)
pH: 6 (ref 5.0–8.0)

## 2017-03-22 LAB — COMPREHENSIVE METABOLIC PANEL
ALT: 38 U/L (ref 14–54)
ANION GAP: 6 (ref 5–15)
AST: 28 U/L (ref 15–41)
Albumin: 4.4 g/dL (ref 3.5–5.0)
Alkaline Phosphatase: 55 U/L (ref 38–126)
BUN: 21 mg/dL — ABNORMAL HIGH (ref 6–20)
CHLORIDE: 109 mmol/L (ref 101–111)
CO2: 26 mmol/L (ref 22–32)
CREATININE: 1.1 mg/dL — AB (ref 0.44–1.00)
Calcium: 9.5 mg/dL (ref 8.9–10.3)
GFR, EST NON AFRICAN AMERICAN: 59 mL/min — AB (ref >60)
Glucose, Bld: 105 mg/dL — ABNORMAL HIGH (ref 65–99)
POTASSIUM: 4.1 mmol/L (ref 3.5–5.1)
SODIUM: 141 mmol/L (ref 135–145)
Total Bilirubin: 0.5 mg/dL (ref 0.3–1.2)
Total Protein: 7.7 g/dL (ref 6.5–8.1)

## 2017-03-22 LAB — CHLAMYDIA/NGC RT PCR (ARMC ONLY)
CHLAMYDIA TR: NOT DETECTED
N GONORRHOEAE: NOT DETECTED

## 2017-03-22 LAB — WET PREP, GENITAL
Sperm: NONE SEEN
Trich, Wet Prep: NONE SEEN
YEAST WET PREP: NONE SEEN

## 2017-03-22 LAB — CBC
HCT: 44.6 % (ref 35.0–47.0)
Hemoglobin: 14.9 g/dL (ref 12.0–16.0)
MCH: 32.1 pg (ref 26.0–34.0)
MCHC: 33.4 g/dL (ref 32.0–36.0)
MCV: 96.1 fL (ref 80.0–100.0)
PLATELETS: 248 10*3/uL (ref 150–440)
RBC: 4.65 MIL/uL (ref 3.80–5.20)
RDW: 12.9 % (ref 11.5–14.5)
WBC: 10.7 10*3/uL (ref 3.6–11.0)

## 2017-03-22 LAB — LIPASE, BLOOD: LIPASE: 32 U/L (ref 11–51)

## 2017-03-22 LAB — HCG, QUANTITATIVE, PREGNANCY: HCG, BETA CHAIN, QUANT, S: 2 m[IU]/mL (ref <5)

## 2017-03-22 MED ORDER — METRONIDAZOLE 500 MG PO TABS
500.0000 mg | ORAL_TABLET | Freq: Two times a day (BID) | ORAL | 0 refills | Status: DC
Start: 2017-03-22 — End: 2017-11-24

## 2017-03-22 MED ORDER — SODIUM CHLORIDE 0.9 % IV BOLUS (SEPSIS)
1000.0000 mL | Freq: Once | INTRAVENOUS | Status: AC
  Administered 2017-03-22: 1000 mL via INTRAVENOUS

## 2017-03-22 MED ORDER — ONDANSETRON HCL 4 MG/2ML IJ SOLN
4.0000 mg | Freq: Once | INTRAMUSCULAR | Status: AC
  Administered 2017-03-22: 4 mg via INTRAVENOUS
  Filled 2017-03-22: qty 2

## 2017-03-22 MED ORDER — ONDANSETRON 4 MG PO TBDP: 4.0000 mg | ORAL_TABLET | Freq: Three times a day (TID) | ORAL | 0 refills | Status: DC | PRN

## 2017-03-22 MED ORDER — SIMETHICONE 80 MG PO CHEW: 80.0000 mg | CHEWABLE_TABLET | Freq: Four times a day (QID) | ORAL | 0 refills | Status: DC | PRN

## 2017-03-22 MED ORDER — FENTANYL CITRATE (PF) 100 MCG/2ML IJ SOLN
50.0000 ug | Freq: Once | INTRAMUSCULAR | Status: AC
  Administered 2017-03-22: 50 ug via INTRAVENOUS
  Filled 2017-03-22: qty 2

## 2017-03-22 NOTE — ED Triage Notes (Addendum)
Patient states "I woke up this morning and felt fine and had a normal BM, since about 0730 I have been having pressure in my rectum area like constipation or gas and shooting pains in pelvic area with cramping in lower back. Episodes are every 20-30 minutes" ambulatory with grimace on face. Emesis 3-4 times. Patient reports hurting her back while working out about a week ago and feels some stiffness in neck today

## 2017-03-22 NOTE — ED Notes (Signed)
ED Provider at bedside. 

## 2017-03-22 NOTE — Discharge Instructions (Signed)
Please take the entire course of antibiotics, even if you are feeling better.  You may take Motrin or Tylenol for pain.  Please take a clear liquid diet for the next 24 hours, then advance to a bland diet.  Please make a follow-up appoint with your primary care physician tomorrow; please ask your primary care physician to follow-up the results of your urine culture.  Return to the emergency department if you develop severe pain, lightheadedness or fainting, fever, inability to keep down fluids, or any other symptoms concerning to you.

## 2017-03-22 NOTE — ED Notes (Signed)
POC preg test negative.  

## 2017-03-22 NOTE — Telephone Encounter (Addendum)
Noted and agree, needs imaging and labs. Doesn't seem like she's gone to any local ED's within [facility], will look out for any visits.

## 2017-03-22 NOTE — ED Notes (Signed)
Patient transported to Ultrasound 

## 2017-03-22 NOTE — Telephone Encounter (Signed)
Pt called with sudden onset of sharp pains that began at 0730. Pt stated she had a "normal" bowel movement at 0630. At 0730 started feeling rectal pressure like she was going to have another BM. The sharp pains then began throughout abdomen. She states pain is a 10 and they comes every 20-30 minutes that last 5-7 minutes. She states pain is mainly located couple of inches below navel and towards her right of her lower abdomen. She states she is having pelvic cramping after the episodes. Pt states she continues to have pressure to rectum but gets no results when she attempts to have BM. Pt stated that she has vomited 3-4 times this am. Advised pt to be driven to the ED and NPO. Pt agreeable.   Reason for Disposition . [1] SEVERE pain (e.g., excruciating) AND [2] present > 1 hour  Answer Assessment - Initial Assessment Questions 1. LOCATION: "Where does it hurt?"      2 inches below navel toward her right side and recatal pressure 2. RADIATION: "Does the pain shoot anywhere else?" (e.g., chest, back)     Through to back and down to pelvic area 3. ONSET: "When did the pain begin?" (e.g., minutes, hours or days ago)      Today at 0730 4. SUDDEN: "Gradual or sudden onset?"     sudden 5. PATTERN "Does the pain come and go, or is it constant?"    - If constant: "Is it getting better, staying the same, or worsening?"      (Note: Constant means the pain never goes away completely; most serious pain is constant and it progresses)     - If intermittent: "How long does it last?" "Do you have pain now?"     (Note: Intermittent means the pain goes away completely between bouts)     Comes and goes lasts 5-7 minutes episodes every 20-30 minutes 6. SEVERITY: "How bad is the pain?"  (e.g., Scale 1-10; mild, moderate, or severe)   - MILD (1-3): doesn't interfere with normal activities, abdomen soft and not tender to touch    - MODERATE (4-7): interferes with normal activities or awakens from sleep, tender to  touch    - SEVERE (8-10): excruciating pain, doubled over, unable to do any normal activities      severe 7. RECURRENT SYMPTOM: "Have you ever had this type of abdominal pain before?" If so, ask: "When was the last time?" and "What happened that time?"      no 8. CAUSE: "What do you think is causing the abdominal pain?"     No idea 9. RELIEVING/AGGRAVATING FACTORS: "What makes it better or worse?" (e.g., movement, antacids, bowel movement)     Nothing makes it worse or better 10. OTHER SYMPTOMS: "Has there been any vomiting, diarrhea, constipation, or urine problems?"       Vomited 3-4 times  Last BM 0630 this am  11. PREGNANCY: "Is there any chance you are pregnant?" "When was your last menstrual period?"       No post menopausal  Protocols used: ABDOMINAL PAIN - [facility]

## 2017-03-22 NOTE — ED Provider Notes (Signed)
[facility] Emergency Department Provider Note  ____________________________________________  Time seen: Approximately 4:47 PM  I have reviewed the triage vital signs and the nursing notes.   HISTORY  Chief Complaint Abdominal Pain    HPI Maria Mejia is a 47 y.o. female w/ a hx of stress induced IBS presenting w/ severe lower abd pain, n/v.  Patient reports that she had a normal bowel movement at 630 this morning, and then at 730 she developed a severe sharp stabbing lower abdominal pain in the suprapubic region.  She has had multiple episodes since then, each lasting approximately 5 minutes, every 20 or 30 minutes although the episodes appear to be less severe in character at this time.  She has had 4 episodes of pain related nausea and vomiting.  No fevers or chills, dysuria, change in vaginal discharge.  She is not sexually active.  Past Medical History:  Diagnosis Date  . Headache   . Migraines     Patient Active Problem List   Diagnosis Date Noted  . Diarrhea 06/09/2016  . Encounter for routine adult medical exam with abnormal findings 10/12/2015  . Hypertriglyceridemia 10/12/2015  . Hip pain 10/12/2015  . Anxiety and depression 09/10/2015  . Ear fullness 09/10/2015  . Migraine 08/07/2015  . FH: brain aneurysm 08/07/2015    History reviewed. No pertinent surgical history.  Current Outpatient Rx  . Order #: [id number] Class: Normal  . Order #: [id number] Class: Normal  . Order #: [id number] Class: Print  . Order #: [id number] Class: Print  . Order #: [id number] Class: Normal  . Order #: [id number] Class: Normal    Allergies Augmentin [amoxicillin-pot clavulanate]; Erythromycin; Omnicef [cefdinir]; and Zithromax [azithromycin]  Family History  Problem Relation Age of Onset  . Migraines Mother   . Stroke Mother   . Migraines Paternal Uncle   . Alzheimer's disease Maternal Grandfather   . Alzheimer's disease Paternal Grandfather     Social  History Social History   Tobacco Use  . Smoking status: Never Smoker  . Smokeless tobacco: Never Used  Substance Use Topics  . Alcohol use: Yes    Alcohol/week: 0.6 oz    Types: 1 Shots of liquor per week    Comment: occcasioally  . Drug use: No    Review of Systems Constitutional: No fever/chills.  No lightheadedness or syncope. Eyes: No visual changes. ENT: No sore throat. No congestion or rhinorrhea. Cardiovascular: Denies chest pain. Denies palpitations. Respiratory: Denies shortness of breath.  No cough. Gastrointestinal: Positive suprapubic abdominal pain.  Positive nausea, + vomiting.  No diarrhea.  No constipation. Genitourinary: Negative for dysuria.  No urinary frequency.  No change in vaginal discharge.  The patient is not sexually active.  She has no history of ovarian pathology in the past.  No history of endometriosis or fibroids Musculoskeletal: Negative for back pain. Skin: Negative for rash. Neurological: Negative for headaches. No focal numbness, tingling or weakness.     ____________________________________________   PHYSICAL EXAM:  VITAL SIGNS: ED Triage Vitals  Enc Vitals Group     BP 03/22/17 1335 (!) 149/80     Pulse Rate 03/22/17 1335 86     Resp 03/22/17 1335 16     Temp 03/22/17 1335 98.9 F (37.2 C)     Temp Source 03/22/17 1335 Oral     SpO2 03/22/17 1335 100 %     Weight 03/22/17 1332 166 lb (75.3 kg)     Height 03/22/17 1332 5\' 3"  (1.6 m)     Head Circumference --  Peak Flow --      Pain Score 03/22/17 1332 8     Pain Loc --      Pain Edu? --      Excl. in [place]? --     Constitutional: Alert and oriented. Well appearing and in no acute distress. Answers questions appropriately. Eyes: Conjunctivae are normal.  EOMI. No scleral icterus. Head: Atraumatic. Nose: No congestion/rhinnorhea. Mouth/Throat: Mucous membranes are moist.  Neck: No stridor.  Supple.   Cardiovascular: Normal rate, regular rhythm. No murmurs, rubs or gallops.   Respiratory: Normal respiratory effort.  No accessory muscle use or retractions. Lungs CTAB.  No wheezes, rales or ronchi. Gastrointestinal: Soft, and nondistended.  Mild tenderness to palpation in the suprapubic region.  No guarding or rebound.  No peritoneal signs. Genitourinary: Normal-appearing external genitalia without lesions. Vaginal exam with physiologic whitish discharge, normal-appearing cervix, normal vaginal wall tissue. Bimanual exam is negative for CMT, adnexal tenderness to palpation, no palpable masses.  Mild tenderness to palpation in the suprapubic region. Musculoskeletal: No LE edema. Neurologic:  A&Ox3.  Speech is clear.  Face and smile are symmetric.  EOMI.  Moves all extremities well. Skin:  Skin is warm, dry and intact. No rash noted. Psychiatric: Mood and affect are normal. Speech and behavior are normal.  Normal judgement.  ____________________________________________   LABS (all labs ordered are listed, but only abnormal results are displayed)  Labs Reviewed  WET PREP, GENITAL - Abnormal; Notable for the following components:      Result Value   Clue Cells Wet Prep HPF POC PRESENT (*)    WBC, Wet Prep HPF POC FEW (*)    All other components within normal limits  COMPREHENSIVE METABOLIC PANEL - Abnormal; Notable for the following components:   Glucose, Bld 105 (*)    BUN 21 (*)    Creatinine, Ser 1.10 (*)    GFR calc non Af Amer 59 (*)    All other components within normal limits  URINALYSIS, COMPLETE (UACMP) WITH MICROSCOPIC - Abnormal; Notable for the following components:   Color, Urine YELLOW (*)    APPearance HAZY (*)    Hgb urine dipstick MODERATE (*)    Bacteria, UA RARE (*)    Squamous Epithelial / LPF 0-5 (*)    All other components within normal limits  CHLAMYDIA/NGC RT PCR ([facility] ONLY)  LIPASE, BLOOD  CBC  HCG, QUANTITATIVE, PREGNANCY   ____________________________________________  EKG  Not  indicated ____________________________________________  RADIOLOGY  US Pelvic Doppler (torsion R/o Or Mass Arterial Flow)  Result Date: 03/22/2017 CLINICAL DATA:  Acute pelvic pain. EXAM: TRANSABDOMINAL AND TRANSVAGINAL ULTRASOUND OF PELVIS DOPPLER ULTRASOUND OF OVARIES TECHNIQUE: Both transabdominal and transvaginal ultrasound examinations of the pelvis were performed. Transabdominal technique was performed for global imaging of the pelvis including uterus, ovaries, adnexal regions, and pelvic cul-de-sac. It was necessary to proceed with endovaginal exam following the transabdominal exam to visualize the endometrium and ovaries. Color and duplex Doppler ultrasound was utilized to evaluate blood flow to the ovaries. COMPARISON:  None. FINDINGS: Uterus Measurements: 5.0 x 3.1 x 2.1 cm. Probable 8 mm fibroid is noted posteriorly. Endometrium Thickness: 2 mm which is within normal limits. No focal abnormality visualized. Right ovary Measurements: 2.1 x 0.9 x 0.9 cm. Normal appearance/no adnexal mass. Left ovary Measurements: 2.9 x 2.5 x 2.3 cm. 2.4 cm simple follicular cyst is noted. Pulsed Doppler evaluation of both ovaries demonstrates normal low-resistance arterial and venous waveforms. Other findings Small amount of free fluid is noted  which may be physiologic. IMPRESSION: Probable small uterine fibroid. Left ovarian follicular cyst is noted. No acute abnormality seen in the pelvis. Electronically Signed   By: Marijo Conception, M.D.   On: 03/22/2017 18:21   US Pelvic Complete With Transvaginal  Result Date: 03/22/2017 CLINICAL DATA:  Acute pelvic pain. EXAM: TRANSABDOMINAL AND TRANSVAGINAL ULTRASOUND OF PELVIS DOPPLER ULTRASOUND OF OVARIES TECHNIQUE: Both transabdominal and transvaginal ultrasound examinations of the pelvis were performed. Transabdominal technique was performed for global imaging of the pelvis including uterus, ovaries, adnexal regions, and pelvic cul-de-sac. It was necessary to proceed  with endovaginal exam following the transabdominal exam to visualize the endometrium and ovaries. Color and duplex Doppler ultrasound was utilized to evaluate blood flow to the ovaries. COMPARISON:  None. FINDINGS: Uterus Measurements: 5.0 x 3.1 x 2.1 cm. Probable 8 mm fibroid is noted posteriorly. Endometrium Thickness: 2 mm which is within normal limits. No focal abnormality visualized. Right ovary Measurements: 2.1 x 0.9 x 0.9 cm. Normal appearance/no adnexal mass. Left ovary Measurements: 2.9 x 2.5 x 2.3 cm. 2.4 cm simple follicular cyst is noted. Pulsed Doppler evaluation of both ovaries demonstrates normal low-resistance arterial and venous waveforms. Other findings Small amount of free fluid is noted which may be physiologic. IMPRESSION: Probable small uterine fibroid. Left ovarian follicular cyst is noted. No acute abnormality seen in the pelvis. Electronically Signed   By: Marijo Conception, M.D.   On: 03/22/2017 18:21    ____________________________________________   PROCEDURES  Procedure(s) performed: None  Procedures  Critical Care performed: No ____________________________________________   INITIAL IMPRESSION / ASSESSMENT AND PLAN / ED COURSE  Pertinent labs & imaging results that were available during my care of the patient were reviewed by me and considered in my medical decision making (see chart for details).  46 y.o. female presenting with intermittent acute episodes of severe sharp stabbing lower abdominal pain with nausea and vomiting.  Overall, the patient is hemodynamically stable and afebrile.  Multiple possible etiologies for the patient's symptoms exist, including ovarian cyst, ovarian torsion, endometriosis, renal colic, appendicitis, gas, IBS.  Initially, will start with a pelvic examination and ultrasound of the pelvis, and plan reevaluation for further imaging.  The patient does have reassuring laboratory studies including a normal white blood cell count, normal  electrolytes.  She has a chronic unchanged renal insufficiency.  She does have some rare bacteriuria without white blood cells or leukocytes, no nitrites.  ----------------------------------------- 7:02 PM on 03/22/2017 -----------------------------------------  The patient's ultrasound is reassuring and does not show any evidence of acute abnormality.  She does have a left ovarian follicular cyst as well as a small uterine fibroid; these are not the cause of her pain.  Since arriving to the emergency department, the patient has not had any further severe episodes although she still feels some lower abdominal crampiness.  I am awaiting the results of her wet prep.  I have offered the patient CT examination, which she has deferred at this time.  We will proceed with a p.o. trial, as well as ambulation, and if the patient developed significant discomfort, we will obtain a CT at that time.  Otherwise, we had a long discussion with the patient and her sister about observation at home and return for worsening symptoms.  ----------------------------------------- 7:31 PM on 03/22/2017 -----------------------------------------  Patient's wet prep does show clue cells, and she will be placed on a 7-day course of Flagyl for that.  Her urine which has been isolated bacteriuria has been sent  for culture.  At this time, the patient continues to feel better, and is tolerating food and liquid without vomiting.  Plan discharge with close PMD follow-up.  Return precautions were discussed.  ____________________________________________  FINAL CLINICAL IMPRESSION(S) / ED DIAGNOSES  Final diagnoses:  Lower abdominal pain  Bacterial vaginosis  Non-intractable vomiting with nausea, unspecified vomiting type  Bacteriuria    Clinical Course as of Mar 22 1930  Wed Mar 22, 2017  1811 US PELVIC DOPPLER (TORSION R/O OR MASS ARTERIAL FLOW) [HV]  1811 US PELVIC COMPLETE WITH TRANSVAGINAL [HV]    Clinical Course User  Index [HV] Antonieta Pert, Hunter R, Student-PA      NEW MEDICATIONS STARTED DURING THIS VISIT:  New Prescriptions   METRONIDAZOLE (FLAGYL) 500 MG TABLET    Take 1 tablet (500 mg total) by mouth 2 (two) times daily.   ONDANSETRON (ZOFRAN ODT) 4 MG DISINTEGRATING TABLET    Take 1 tablet (4 mg total) by mouth every 8 (eight) hours as needed for nausea or vomiting.      Eula Listen, MD 03/22/17 [phone]

## 2017-03-23 ENCOUNTER — Encounter: Payer: Self-pay | Admitting: Primary Care

## 2017-03-29 DIAGNOSIS — D239 Other benign neoplasm of skin, unspecified: Secondary | ICD-10-CM

## 2017-03-29 DIAGNOSIS — C4491 Basal cell carcinoma of skin, unspecified: Secondary | ICD-10-CM

## 2017-03-29 HISTORY — DX: Basal cell carcinoma of skin, unspecified: C44.91

## 2017-03-29 HISTORY — DX: Other benign neoplasm of skin, unspecified: D23.9

## 2017-04-26 ENCOUNTER — Encounter: Payer: Self-pay | Admitting: Primary Care

## 2017-04-26 DIAGNOSIS — R197 Diarrhea, unspecified: Secondary | ICD-10-CM

## 2017-04-26 MED ORDER — DICYCLOMINE HCL 10 MG PO CAPS: ORAL_CAPSULE | ORAL | 0 refills | Status: DC

## 2017-05-10 ENCOUNTER — Encounter: Payer: Self-pay | Admitting: Primary Care

## 2017-05-10 ENCOUNTER — Other Ambulatory Visit: Payer: Self-pay | Admitting: Primary Care

## 2017-05-10 DIAGNOSIS — M542 Cervicalgia: Secondary | ICD-10-CM

## 2017-05-17 ENCOUNTER — Encounter: Payer: Self-pay | Admitting: Primary Care

## 2017-06-21 ENCOUNTER — Other Ambulatory Visit: Payer: Self-pay | Admitting: Primary Care

## 2017-06-21 DIAGNOSIS — G43701 Chronic migraine without aura, not intractable, with status migrainosus: Secondary | ICD-10-CM

## 2017-06-21 NOTE — Telephone Encounter (Signed)
Noted, refill sent to pharmacy. 

## 2017-06-21 NOTE — Telephone Encounter (Signed)
Last Rx 03/17/17 #9 1R. Last OV 01/2017

## 2017-07-22 ENCOUNTER — Other Ambulatory Visit: Payer: Self-pay | Admitting: Primary Care

## 2017-07-22 DIAGNOSIS — G43701 Chronic migraine without aura, not intractable, with status migrainosus: Secondary | ICD-10-CM

## 2017-07-24 NOTE — Telephone Encounter (Signed)
Is she still using 9 tablets monthly? Needing a refill? Is she interested in seeing the headache specialist as discussed during her last visit? I recommend this if her headaches haven't improved.

## 2017-07-24 NOTE — Telephone Encounter (Signed)
Sent patient a MyChart message regarding refill request

## 2017-07-24 NOTE — Telephone Encounter (Signed)
Ok to refill? Electronically refill request for zolmitriptan (ZOMIG-ZMT) 5 MG disintegrating tablet  Last prescribed on 06/21/2017  Last seen on 02/28/2017

## 2017-07-25 NOTE — Telephone Encounter (Signed)
Per MyChart message  I had some cosmetic botox a couple months ago and that has improved them some..I dont need referral at this point just the refills..Thanks

## 2017-07-25 NOTE — Telephone Encounter (Signed)
Refill sent to pharmacy.   

## 2017-08-16 ENCOUNTER — Other Ambulatory Visit: Payer: Self-pay | Admitting: Primary Care

## 2017-08-16 DIAGNOSIS — R197 Diarrhea, unspecified: Secondary | ICD-10-CM

## 2017-09-17 ENCOUNTER — Other Ambulatory Visit: Payer: Self-pay | Admitting: Primary Care

## 2017-09-17 DIAGNOSIS — G43701 Chronic migraine without aura, not intractable, with status migrainosus: Secondary | ICD-10-CM

## 2017-09-21 ENCOUNTER — Other Ambulatory Visit: Payer: Self-pay | Admitting: Primary Care

## 2017-09-21 DIAGNOSIS — F329 Major depressive disorder, single episode, unspecified: Secondary | ICD-10-CM

## 2017-09-21 DIAGNOSIS — F419 Anxiety disorder, unspecified: Principal | ICD-10-CM

## 2017-10-25 ENCOUNTER — Other Ambulatory Visit: Payer: Self-pay | Admitting: Primary Care

## 2017-10-25 DIAGNOSIS — G43701 Chronic migraine without aura, not intractable, with status migrainosus: Secondary | ICD-10-CM

## 2017-10-25 NOTE — Telephone Encounter (Signed)
Noted, refill sent to pharmacy. 

## 2017-10-25 NOTE — Telephone Encounter (Signed)
Last prescribed on 09/18/2017 Last office visit on 02/28/2017

## 2017-11-24 ENCOUNTER — Ambulatory Visit (INDEPENDENT_AMBULATORY_CARE_PROVIDER_SITE_OTHER): Payer: Managed Care, Other (non HMO) | Admitting: Primary Care

## 2017-11-24 ENCOUNTER — Encounter: Payer: Self-pay | Admitting: Primary Care

## 2017-11-24 VITALS — BP 124/80 | HR 70 | Temp 98.9°F | Ht 63.75 in | Wt 170.5 lb

## 2017-11-24 DIAGNOSIS — Z23 Encounter for immunization: Secondary | ICD-10-CM

## 2017-11-24 DIAGNOSIS — R1310 Dysphagia, unspecified: Secondary | ICD-10-CM | POA: Insufficient documentation

## 2017-11-24 MED ORDER — OMEPRAZOLE 20 MG PO CPDR: 20.0000 mg | DELAYED_RELEASE_CAPSULE | Freq: Every day | ORAL | 0 refills | Status: AC

## 2017-11-24 NOTE — Progress Notes (Signed)
Subjective:    Patient ID: Maria Mejia, female    DOB: [birth year], 47 y.o.   MRN: [id number]  HPI  Maria Mejia is a 47 year old female with a history of migraines and diarrhea who presents today with a chief complaint of swallowing problems.   Five years ago she started noticing painful and difficulty swallowing of food with food getting stuck in the upper substernal region. Over the last one year she's noticed an increased frequency in painful swallowing and food getting stuck, this occurs once weekly on average. She feels that the food will just "sit" in the chest before moving. She's been eating slower and chewing food thoroughly but continues to experience symptoms. She will sometimes have to cough or clear her throat in order for food to move down. She denies choking.   She's never undergone endoscopy for this in the past. She denies a family history of dysphagia. She once had GERD years ago and was taking Pepcid with relief. She denies esophageal burning, abdominal pain, nausea, diarrhea.   Review of Systems  HENT: Positive for trouble swallowing.   Gastrointestinal: Negative for abdominal pain, nausea and vomiting.       Past Medical History:  Diagnosis Date  . Headache   . Migraines      Social History   Socioeconomic History  . Marital status: Divorced    Spouse name: Not on file  . Number of children: Not on file  . Years of education: Not on file  . Highest education level: Not on file  Occupational History  . Not on file  Social Needs  . Financial resource strain: Not on file  . Food insecurity:    Worry: Not on file    Inability: Not on file  . Transportation needs:    Medical: Not on file    Non-medical: Not on file  Tobacco Use  . Smoking status: Never Smoker  . Smokeless tobacco: Never Used  Substance and Sexual Activity  . Alcohol use: Yes    Alcohol/week: 1.0 standard drinks    Types: 1 Shots of liquor per week    Comment: occcasioally  . Drug use:  No  . Sexual activity: Not on file  Lifestyle  . Physical activity:    Days per week: Not on file    Minutes per session: Not on file  . Stress: Not on file  Relationships  . Social connections:    Talks on phone: Not on file    Gets together: Not on file    Attends religious service: Not on file    Active member of club or organization: Not on file    Attends meetings of clubs or organizations: Not on file    Relationship status: Not on file  . Intimate partner violence:    Fear of current or ex partner: Not on file    Emotionally abused: Not on file    Physically abused: Not on file    Forced sexual activity: Not on file  Other Topics Concern  . Not on file  Social History Narrative  . Not on file    No past surgical history on file.  Family History  Problem Relation Age of Onset  . Migraines Mother   . Stroke Mother   . Migraines Paternal Uncle   . Alzheimer's disease Maternal Grandfather   . Alzheimer's disease Paternal Grandfather     Allergies  Allergen Reactions  . Augmentin [Amoxicillin-Pot Clavulanate]  Upset stomach  . Erythromycin Other (See Comments)    Upset stomach  . Omnicef [Cefdinir]     Upset stomach   . Zithromax [Azithromycin]     Current Outpatient Medications on File Prior to Visit  Medication Sig Dispense Refill  . buPROPion (WELLBUTRIN XL) 150 MG 24 hr tablet TAKE ONE TABLET BY MOUTH DAILY 30 tablet 5  . zolmitriptan (ZOMIG-ZMT) 5 MG disintegrating tablet TAKE ONE TABLET BY MOUTH AT ONSET OF HEADACHE; MAY REPEAT ONE TABLET IN 2 HOURS IF NEEDED. 9 tablet 0  . dicyclomine (BENTYL) 10 MG capsule TAKE ONE CAPSULE BY MOUTH THREE TIMES A DAY BEFORE MEALS AS NEEDED FOR IBS SYMPTOMS (Patient not taking: Reported on 11/24/2017) 30 capsule 0  . ondansetron (ZOFRAN ODT) 4 MG disintegrating tablet Take 1 tablet (4 mg total) by mouth every 8 (eight) hours as needed for nausea or vomiting. (Patient not taking: Reported on 11/24/2017) 20 tablet 0    No current facility-administered medications on file prior to visit.     BP 124/80   Pulse 70   Temp 98.9 F (37.2 C) (Oral)   Ht 5' 3.75" (1.619 m)   Wt 170 lb 8 oz (77.3 kg)   SpO2 98%   BMI 29.50 kg/m    Objective:   Physical Exam  Constitutional: She appears well-nourished.  Neck: Neck supple.  Cardiovascular: Normal rate and regular rhythm.  Respiratory: Effort normal and breath sounds normal.  Skin: Skin is warm and dry.           Assessment & Plan:

## 2017-11-24 NOTE — Assessment & Plan Note (Signed)
Chronic for 5 years, worse over last one year. Referral placed to GI for further evaluation and perhaps endoscopy. Rx for omeprazole 20 mg sent to pharmacy. Exam stable today.

## 2017-11-24 NOTE — Patient Instructions (Signed)
You will be contacted regarding your referral to GI for evaluation of swallowing.  Please let us know if you have not been contacted within one week.   Start omeprazole 20 mg daily for difficulty swallowing and heartburn.   Make sure to thoroughly chew your food and eat slowly. Drink plenty of water with meals.   It was a pleasure to see you today!

## 2017-12-05 ENCOUNTER — Other Ambulatory Visit: Payer: Self-pay | Admitting: Primary Care

## 2017-12-05 DIAGNOSIS — G43701 Chronic migraine without aura, not intractable, with status migrainosus: Secondary | ICD-10-CM

## 2017-12-07 NOTE — Telephone Encounter (Signed)
Noted, refill sent to pharmacy. 

## 2017-12-07 NOTE — Telephone Encounter (Signed)
Last prescribed on 10/25/2017 Last office visit on 11/24/2017

## 2018-01-03 ENCOUNTER — Ambulatory Visit (INDEPENDENT_AMBULATORY_CARE_PROVIDER_SITE_OTHER): Payer: 59 | Admitting: Family Medicine

## 2018-01-03 ENCOUNTER — Encounter: Payer: Self-pay | Admitting: Emergency Medicine

## 2018-01-03 ENCOUNTER — Encounter: Payer: Self-pay | Admitting: Family Medicine

## 2018-01-03 VITALS — BP 132/86 | HR 98 | Temp 98.1°F | Ht 63.75 in | Wt 173.8 lb

## 2018-01-03 DIAGNOSIS — J069 Acute upper respiratory infection, unspecified: Secondary | ICD-10-CM | POA: Diagnosis not present

## 2018-01-03 MED ORDER — DOXYCYCLINE HYCLATE 100 MG PO CAPS: 100.0000 mg | ORAL_CAPSULE | Freq: Two times a day (BID) | ORAL | 0 refills | Status: DC

## 2018-01-03 NOTE — Patient Instructions (Addendum)
For nasal congestion you can use Afrin nasal spray for 3 days max, Sudafed, saline nasal spray (generic is fine for all). For cough you can try Delsym or Mucinex DM Drink enough fluids to make your urine light yellow. For fever/chill/muscle aches you can take over the counter acetaminophen or ibuprofen.  Please come back in if you are not better in 5-7 days or if you develop wheezing, shortness of breath or persistent vomiting.  If not better in 2-3 days start antibiotic- take with food and a full glass of water

## 2018-01-03 NOTE — Progress Notes (Signed)
Subjective:    Patient ID: Maria Mejia, female    DOB: 1970-12-11, 47 y.o.   MRN: [id number]  HPI This is a 47 year old female who presents today with 4 weeks of heavy cough which got better. Had head pressure several weeks ago which got better. She flew over the Thanksgiving weekend and was at her father's house and he is a heavy smoker. Over last several days developed dry cough, sinus pressure, little drainage and mild nausea. No diarrhea. Chills yesterday. No fever. She also had some sharp pains in her back today, no known trauma or overuse. Overall achy. Has taken some sudafed only. Feels SOB/wheezy. 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 Had influenza vaccine.   Has GI appointment tomorrow for dysphagia.    Past Medical History:  Diagnosis Date  . Headache   . Migraines    No past surgical history on file. Family History  Problem Relation Age of Onset  . Migraines Mother   . Stroke Mother   . Migraines Paternal Uncle   . Alzheimer's disease Maternal Grandfather   . Alzheimer's disease Paternal Grandfather    Social History   Tobacco Use  . Smoking status: Never Smoker  . Smokeless tobacco: Never Used  Substance Use Topics  . Alcohol use: Yes    Alcohol/week: 1.0 standard drinks    Types: 1 Shots of liquor per week    Comment: occcasioally  . Drug use: No      Review of Systems Per HPI    Objective:   Physical Exam  Constitutional: She appears well-developed and well-nourished. She appears ill. No distress.  HENT:  Right Ear: Tympanic membrane, external ear and ear canal normal.  Left Ear: Tympanic membrane, external ear and ear canal normal.  Nose: Mucosal edema present. No rhinorrhea. Right sinus exhibits no maxillary sinus tenderness and no frontal sinus tenderness. Left sinus exhibits no maxillary sinus tenderness and no frontal sinus tenderness.  Mouth/Throat: Uvula is midline and oropharynx is clear and moist. Mucous membranes are dry.  Eyes: Conjunctivae  are normal.  Cardiovascular: Normal rate, regular rhythm and normal heart sounds.  Pulmonary/Chest: Effort normal and breath sounds normal.  Dry cough witnessed  Skin: Skin is warm and dry. She is not diaphoretic.  Psychiatric: She has a normal mood and affect. Her behavior is normal. Judgment and thought content normal.  Vitals reviewed.     BP 132/86 (BP Location: Left Arm)   Pulse 98   Temp 98.1 F (36.7 C) (Oral)   Ht 5' 3.75" (1.619 m)   Wt 173 lb 12.8 oz (78.8 kg)   SpO2 98%   BMI 30.07 kg/m  Wt Readings from Last 3 Encounters:  01/03/18 173 lb 12.8 oz (78.8 kg)  11/24/17 170 lb 8 oz (77.3 kg)  03/22/17 166 lb (75.3 kg)       Assessment & Plan:  1. URI with cough and congestion - Provided written and verbal information regarding diagnosis and treatment. - suspect viral etiology but provided her with a wait see prescription for doxy 100 mg po bid x 7 days if not improved in 48-72 hours - she has ondansetron at home for nausea Patient Instructions  For nasal congestion you can use Afrin nasal spray for 3 days max, Sudafed, saline nasal spray (generic is fine for all). For cough you can try Delsym or Mucinex DM Drink enough fluids to make your urine light yellow. For fever/chill/muscle aches you can take over the counter acetaminophen or ibuprofen.  Please  come back in if you are not better in 5-7 days or if you develop wheezing, shortness of breath or persistent vomiting.  If not better in 2-3 days start antibiotic- take with food and a full glass of water     Clarene Reamer, FNP-BC  [facility], [place]  01/03/2018 10:29 AM

## 2018-01-04 ENCOUNTER — Encounter: Payer: Self-pay | Admitting: *Deleted

## 2018-01-04 ENCOUNTER — Ambulatory Visit: Payer: Managed Care, Other (non HMO) | Admitting: Gastroenterology

## 2018-01-06 ENCOUNTER — Other Ambulatory Visit: Payer: Self-pay | Admitting: Primary Care

## 2018-01-06 DIAGNOSIS — G43701 Chronic migraine without aura, not intractable, with status migrainosus: Secondary | ICD-10-CM

## 2018-01-08 DIAGNOSIS — J069 Acute upper respiratory infection, unspecified: Secondary | ICD-10-CM

## 2018-01-08 MED ORDER — BENZONATATE 200 MG PO CAPS: 200.0000 mg | ORAL_CAPSULE | Freq: Three times a day (TID) | ORAL | 0 refills | Status: DC | PRN

## 2018-01-08 NOTE — Telephone Encounter (Signed)
Last prescribed on 12/17/2017  Last office visit on 01/03/2018 for URI with Jackelyn Poling

## 2018-01-08 NOTE — Telephone Encounter (Signed)
Patient will be due for general follow-up visit in January 2020.  This can be a physical if she would like.  Play schedule.

## 2018-01-10 ENCOUNTER — Other Ambulatory Visit: Payer: Self-pay | Admitting: Family Medicine

## 2018-01-10 MED ORDER — PREDNISONE 20 MG PO TABS: 40.0000 mg | ORAL_TABLET | Freq: Every day | ORAL | 0 refills | Status: AC

## 2018-01-10 MED ORDER — ALBUTEROL SULFATE HFA 108 (90 BASE) MCG/ACT IN AERS: 2.0000 | INHALATION_SPRAY | RESPIRATORY_TRACT | 1 refills | Status: DC | PRN

## 2018-01-10 NOTE — Telephone Encounter (Signed)
Pt states she will call back to schedule. She does not have her schedule in front of her.

## 2018-01-10 NOTE — Telephone Encounter (Signed)
Debbie, do you think she needs to be re-evaluated by me or were you thinking of any additional treatment? I appreciate you seeing her.

## 2018-01-18 ENCOUNTER — Encounter: Payer: Self-pay | Admitting: Family Medicine

## 2018-01-18 ENCOUNTER — Ambulatory Visit (INDEPENDENT_AMBULATORY_CARE_PROVIDER_SITE_OTHER): Payer: 59 | Admitting: Family Medicine

## 2018-01-18 VITALS — BP 124/90 | HR 84 | Temp 99.3°F | Ht 63.75 in | Wt 176.5 lb

## 2018-01-18 DIAGNOSIS — B9789 Other viral agents as the cause of diseases classified elsewhere: Secondary | ICD-10-CM | POA: Diagnosis not present

## 2018-01-18 DIAGNOSIS — F329 Major depressive disorder, single episode, unspecified: Secondary | ICD-10-CM

## 2018-01-18 DIAGNOSIS — F419 Anxiety disorder, unspecified: Secondary | ICD-10-CM | POA: Diagnosis not present

## 2018-01-18 DIAGNOSIS — J069 Acute upper respiratory infection, unspecified: Secondary | ICD-10-CM | POA: Diagnosis not present

## 2018-01-18 DIAGNOSIS — F32A Depression, unspecified: Secondary | ICD-10-CM

## 2018-01-18 DIAGNOSIS — G43701 Chronic migraine without aura, not intractable, with status migrainosus: Secondary | ICD-10-CM

## 2018-01-18 MED ORDER — BUPROPION HCL ER (XL) 150 MG PO TB24: 150.0000 mg | ORAL_TABLET | Freq: Every day | ORAL | 5 refills | Status: DC

## 2018-01-18 MED ORDER — ZOLMITRIPTAN 5 MG PO TBDP: ORAL_TABLET | ORAL | 0 refills | Status: DC

## 2018-01-18 NOTE — Assessment & Plan Note (Signed)
Stable. Refill provided. F/u with pcp to discuss stopping

## 2018-01-18 NOTE — Patient Instructions (Signed)
Based on your symptoms, it looks like you have a virus.   Antibiotics are not need for a viral infection but the following will help:   1. Drink plenty of fluids 2. Get lots of rest  Sinus Congestion 1) Saline spray to help with sinus output 2) Flonase (Store Brand ok) - once daily 3) Over the counter congestion medications - Ok to try Pseudoephedrine 4) Antihistamine - zyrtec (store brand) - for a few days to look for improvement   Cough 1) Cough drops can be helpful 2) Nyquil (or nighttime cough medication) 3) Honey is proven to be one of the best cough medications   If you start spiking fevers or your cough is still around in early Jan then return

## 2018-01-18 NOTE — Progress Notes (Signed)
Subjective:     Maria Mejia is a 47 y.o. female presenting for Cough (x 1 month. Patient has seen Clarene Reamer, NP on 01/03/18. Patient has taking Sudafed, DOxy, Prednisone, inhaler, Mucinex. She did not really feel better with all the regimen she has done but did have some improvement with energy level at one point. Patient is taking Robituissin DM and using Rhinocort at this time. No fever that patient is aware)     HPI  #Migraines - takes Zolmitriptan - also does botox which has significantly helped - has been 2 weeks since last migraine - using only when needed  #Depression - taking bupropion - doing well - eventually wants to decrease   #Cough - has completed prednisone, doxycycline, mucinex - using robussitin DM - no fever - still with sinus discharge - using Rhinocort - was initially chest and now more head congestion - Started getting Sick on 12/28/2017 - Not doing saline rinses - gags - cannot tolerate - cramping more with activity - trying to drink lots of water - Continues to feel ill, but symptoms are changing from head to chest -   01/03/2018: Clinic - URI with cough - doxy 100 mg BID  Review of Systems  HENT: Positive for congestion.   Respiratory: Positive for cough and chest tightness.      Social History   Tobacco Use  Smoking Status Never Smoker  Smokeless Tobacco Never Used        Objective:    BP Readings from Last 3 Encounters:  01/18/18 124/90  01/03/18 132/86  11/24/17 124/80   Wt Readings from Last 3 Encounters:  01/18/18 176 lb 8 oz (80.1 kg)  01/03/18 173 lb 12.8 oz (78.8 kg)  11/24/17 170 lb 8 oz (77.3 kg)    BP 124/90   Pulse 84   Temp 99.3 F (37.4 C)   Ht 5' 3.75" (1.619 m)   Wt 176 lb 8 oz (80.1 kg)   SpO2 98%   BMI 30.53 kg/m    Physical Exam Constitutional:      General: She is not in acute distress.    Appearance: She is well-developed. She is not diaphoretic.  HENT:     Head: Normocephalic and  atraumatic.     Right Ear: Tympanic membrane and ear canal normal.     Left Ear: Tympanic membrane and ear canal normal.     Nose: Mucosal edema and rhinorrhea present.     Right Sinus: No maxillary sinus tenderness or frontal sinus tenderness.     Left Sinus: No maxillary sinus tenderness or frontal sinus tenderness.     Mouth/Throat:     Pharynx: Uvula midline. Posterior oropharyngeal erythema present. No oropharyngeal exudate.     Tonsils: Swelling: 0 on the right. 0 on the left.  Eyes:     General: No scleral icterus.    Conjunctiva/sclera: Conjunctivae normal.  Neck:     Musculoskeletal: Neck supple.  Cardiovascular:     Rate and Rhythm: Normal rate and regular rhythm.     Heart sounds: Normal heart sounds. No murmur.  Pulmonary:     Effort: Pulmonary effort is normal. No respiratory distress.     Breath sounds: Normal breath sounds.  Lymphadenopathy:     Cervical: No cervical adenopathy.  Skin:    General: Skin is warm and dry.     Capillary Refill: Capillary refill takes less than 2 seconds.  Neurological:     Mental Status: She is alert.  Assessment & Plan:   Problem List Items Addressed This Visit      Cardiovascular and Mediastinum   Migraine    Much better with botox and less need for zomig but does need refill soon. Refill provided #9 pills. PCP follow-up to discuss further refills. No longer on preventative medication.       Relevant Medications   zolmitriptan (ZOMIG-ZMT) 5 MG disintegrating tablet   buPROPion (WELLBUTRIN XL) 150 MG 24 hr tablet     Other   Anxiety and depression    Stable. Refill provided. F/u with pcp to discuss stopping      Relevant Medications   buPROPion (WELLBUTRIN XL) 150 MG 24 hr tablet    Other Visit Diagnoses    Viral URI with cough    -  Primary     Pt on week 3 of symptoms. Discussed that cough can linger for up to 6 weeks.   Trial of antihistamine to see if symptoms improve and continue flonase.   Has  already completed course of Abx and steroids w/o improvement and not continuing have fevers so lower suspicion for bacterial etiology at this time.   If not improved in next 2-3 weeks return and will consider CXR at that time.   Return in about 3 weeks (around 02/08/2018), or if symptoms worsen or fail to improve.  Lesleigh Noe, MD

## 2018-01-18 NOTE — Assessment & Plan Note (Signed)
Much better with botox and less need for zomig but does need refill soon. Refill provided #9 pills. PCP follow-up to discuss further refills. No longer on preventative medication.

## 2018-01-22 ENCOUNTER — Ambulatory Visit: Payer: 59 | Admitting: Primary Care

## 2018-02-15 ENCOUNTER — Encounter: Payer: Self-pay | Admitting: Gastroenterology

## 2018-02-15 ENCOUNTER — Ambulatory Visit (INDEPENDENT_AMBULATORY_CARE_PROVIDER_SITE_OTHER): Payer: 59 | Admitting: Gastroenterology

## 2018-02-15 VITALS — BP 125/79 | HR 108 | Ht 63.0 in | Wt 179.6 lb

## 2018-02-15 DIAGNOSIS — R1310 Dysphagia, unspecified: Secondary | ICD-10-CM

## 2018-02-15 DIAGNOSIS — R1319 Other dysphagia: Secondary | ICD-10-CM

## 2018-02-15 NOTE — Progress Notes (Signed)
Gastroenterology Consultation  Referring Provider:     Pleas Koch, NP Primary Care Physician:  Pleas Koch, NP Primary Gastroenterologist:  Dr. Allen Norris     Reason for Consultation:     Dysphasia        HPI:   Maria Mejia is a 48 y.o. y/o female referred for consultation & management of dysphasia by Dr. Carlis Abbott, Leticia Penna, NP.  This patient comes in today with a history of dysphasia.  The patient had an appoint with me on November 04, 2017 but that was canceled.  At the time of seeing her primary care provider the patient had reported she was having problems with substernal chest pain and dysphasia for 5 years.  The symptoms had progressed over the previous year.  Now the symptoms were occurring approximately once a week.  The patient denies any history of heartburn or unexplained weight loss.  The patient has been eating smaller sizes of food to compensate for her dysphasia. Besides the dysphagia the patient also reports that she has episodes of painful swallowing.  She is now reporting the symptoms have been more bothersome although they do not stop her from eating nor has she had any food sticking in the esophagus without being able to bring it up.  She reports that there is no certain foods that make her having trouble swallowing more than any other food.  Past Medical History:  Diagnosis Date  . Headache   . Migraines     No past surgical history on file.  Prior to Admission medications   Medication Sig Start Date End Date Taking? Authorizing Provider  albuterol (PROVENTIL HFA;VENTOLIN HFA) 108 (90 Base) MCG/ACT inhaler Inhale 2 puffs into the lungs every 4 (four) hours as needed for wheezing or shortness of breath (cough, shortness of breath or wheezing.). 01/10/18   Elby Beck, FNP  benzonatate (TESSALON) 200 MG capsule Take 1 capsule (200 mg total) by mouth 3 (three) times daily as needed for cough. 01/08/18   Pleas Koch, NP  buPROPion (WELLBUTRIN XL) 150 MG  24 hr tablet Take 1 tablet (150 mg total) by mouth daily. 01/18/18   Lesleigh Noe, MD  dicyclomine (BENTYL) 10 MG capsule TAKE ONE CAPSULE BY MOUTH THREE TIMES A DAY BEFORE MEALS AS NEEDED FOR IBS SYMPTOMS 08/16/17   Pleas Koch, NP  omeprazole (PRILOSEC) 20 MG capsule Take 1 capsule (20 mg total) by mouth daily. Patient not taking: Reported on 01/18/2018 11/24/17   Pleas Koch, NP  ondansetron (ZOFRAN ODT) 4 MG disintegrating tablet Take 1 tablet (4 mg total) by mouth every 8 (eight) hours as needed for nausea or vomiting. 03/22/17   Eula Listen, MD  zolmitriptan (ZOMIG-ZMT) 5 MG disintegrating tablet PLACE ONE TABLET BY MOUTH AT ONSET OF HEADACHE. MAY REPEAT ONE TABLET IN 2 HOURS IF NEEDED 01/18/18   Lesleigh Noe, MD    Family History  Problem Relation Age of Onset  . Migraines Mother   . Stroke Mother   . Migraines Paternal Uncle   . Alzheimer's disease Maternal Grandfather   . Alzheimer's disease Paternal Grandfather      Social History   Tobacco Use  . Smoking status: Never Smoker  . Smokeless tobacco: Never Used  Substance Use Topics  . Alcohol use: Yes    Alcohol/week: 1.0 standard drinks    Types: 1 Shots of liquor per week    Comment: occcasioally  . Drug use: No  Allergies as of 02/15/2018 - Review Complete 02/15/2018  Allergen Reaction Noted  . Augmentin [amoxicillin-pot clavulanate]  08/07/2015  . Erythromycin Other (See Comments) 08/07/2015  . Omnicef [cefdinir]  08/07/2015  . Zithromax [azithromycin]  08/07/2015    Review of Systems:    All systems reviewed and negative except where noted in HPI.   Physical Exam:  BP 125/79   Pulse (!) 108   Ht 5\' 3"  (1.6 m)   Wt 179 lb 9.6 oz (81.5 kg)   BMI 31.81 kg/m  No LMP recorded. Patient is postmenopausal. General:   Alert,  Well-developed, well-nourished, pleasant and cooperative in NAD Head:  Normocephalic and atraumatic. Eyes:  Sclera clear, no icterus.   Conjunctiva  pink. Ears:  Normal auditory acuity. Nose:  No deformity, discharge, or lesions. Mouth:  No deformity or lesions,oropharynx pink & moist. Neck:  Supple; no masses or thyromegaly. Lungs:  Respirations even and unlabored.  Clear throughout to auscultation.   No wheezes, crackles, or rhonchi. No acute distress. Heart:  Regular rate and rhythm; no murmurs, clicks, rubs, or gallops. Abdomen:  Normal bowel sounds.  No bruits.  Soft, non-tender and non-distended without masses, hepatosplenomegaly or hernias noted.  No guarding or rebound tenderness.  Negative Carnett sign.   Rectal:  Deferred.  Msk:  Symmetrical without gross deformities.  Good, equal movement & strength bilaterally. Pulses:  Normal pulses noted. Extremities:  No clubbing or edema.  No cyanosis. Neurologic:  Alert and oriented x3;  grossly normal neurologically. Skin:  Intact without significant lesions or rashes.  No jaundice. Lymph Nodes:  No significant cervical adenopathy. Psych:  Alert and cooperative. Normal mood and affect.  Imaging Studies: No results found.  Assessment and Plan:   Daisa Stennis is a 48 y.o. y/o female who comes in today with dysphagia and odynophagia.  The patient denies any history of immunocompromise or steroid use which would lead towards the possible diagnosis of candidal esophagitis. The patient's symptoms are more consistent with esophageal spasms.  The patient was tried on 20 mg of omeprazole in the past and states that that did not help her symptoms.  The patient will be started on Dexilant 60 mg for the next few weeks and see if that helps her symptoms.  It does the patient will contact us for a prescription for the medication if it doesn't she has been told that she should be set up for an EGD.  Pursuant to that the patient may need a esophageal manometry to see if she is having esophageal spasms as a cause for her dysphagia and odynophagia.  The patient has been explained the plan and agrees with  it.  Lucilla Lame, MD. Marval Regal    Note: This dictation was prepared with Dragon dictation along with smaller phrase technology. Any transcriptional errors that result from this process are unintentional.

## 2018-03-12 ENCOUNTER — Other Ambulatory Visit: Payer: Self-pay

## 2018-03-12 MED ORDER — DEXLANSOPRAZOLE 60 MG PO CPDR: 60.0000 mg | DELAYED_RELEASE_CAPSULE | Freq: Every day | ORAL | 6 refills | Status: DC

## 2018-04-03 ENCOUNTER — Other Ambulatory Visit: Payer: Self-pay | Admitting: Family Medicine

## 2018-04-03 DIAGNOSIS — G43701 Chronic migraine without aura, not intractable, with status migrainosus: Secondary | ICD-10-CM

## 2018-04-04 NOTE — Telephone Encounter (Signed)
Last prescribed on 01/18/2018  . Last office visit on 01/18/2018 with Dr Einar Pheasant. No future appointment

## 2018-04-04 NOTE — Telephone Encounter (Signed)
Please notify patient that she is due for a follow-up visit for general medical conditions with me. Please schedule at her convenience. Refill sent to pharmacy.

## 2018-04-04 NOTE — Telephone Encounter (Signed)
Dr. Einar Pheasant refilled when patient came in for acute visit once. Routing to patient's PCP to address at this time

## 2018-04-05 NOTE — Telephone Encounter (Signed)
Patient states she was just in the office a few months ago and discussed this. Advised her that the visit was for an acute issue and not to discuss general medical conditions. She states she will call her OB to see if they will fill medicine and if not, she will call to schedule.

## 2018-04-06 NOTE — Telephone Encounter (Signed)
Please notify patient that I sent refills for her Zomig already.  I have not seen patient since October 2019. Is she still taking Wellbutrin, Dexilant, dicyclomine?

## 2018-04-06 NOTE — Telephone Encounter (Signed)
Message left for patient to return my call.  

## 2018-04-06 NOTE — Telephone Encounter (Signed)
Sent patient a message.

## 2018-04-06 NOTE — Telephone Encounter (Signed)
See my chart message

## 2018-05-07 ENCOUNTER — Other Ambulatory Visit: Payer: Self-pay | Admitting: Primary Care

## 2018-05-07 DIAGNOSIS — G43701 Chronic migraine without aura, not intractable, with status migrainosus: Secondary | ICD-10-CM

## 2018-05-08 NOTE — Telephone Encounter (Signed)
Last prescribed on 04/04/2018. Last office visit on 01/18/2018 with Dr Einar Pheasant for acute . No future appointment

## 2018-05-08 NOTE — Telephone Encounter (Signed)
Noted, refill sent to pharmacy. 

## 2018-06-04 ENCOUNTER — Other Ambulatory Visit: Payer: Self-pay | Admitting: Primary Care

## 2018-06-04 DIAGNOSIS — G43701 Chronic migraine without aura, not intractable, with status migrainosus: Secondary | ICD-10-CM

## 2018-06-05 NOTE — Telephone Encounter (Signed)
Noted, refill sent to pharmacy. 

## 2018-06-05 NOTE — Telephone Encounter (Signed)
Last prescribed on 05/08/2018. Last office visit on 01/18/2018 with Dr Einar Pheasant for acute . No future appointment

## 2018-06-28 ENCOUNTER — Ambulatory Visit: Payer: Self-pay

## 2018-06-28 NOTE — Telephone Encounter (Signed)
I spoke with patient via my chart, she will be coming in on Friday May 29th.

## 2018-06-28 NOTE — Telephone Encounter (Signed)
Pt called stating that she has had severe abdominal cramping about every 30 minutes.  She states that she is passing stools each time.  She states that they are [place] but have become more of a mucus.  She is also passing blood which she says is in the water and when she wipes she has watery blood on her tissue. She states the the urge to bare down is extreme when the pain hits.  She denies pregnancy. She is on LoLo and has not had intercourse for 3-4 months. She states that the pain has caused her dry heaves. She denies swelling or bloating of the abdomin.  Pt was advised to go to the ER for evaluation. Pt refuses. She states that last year when she had the same it cost her thousands of dollars.  Reason for Disposition . Blood in bowel movements   (Exception: blood on surface of BM with constipation)  Answer Assessment - Initial Assessment Questions 1. LOCATION: "Where does it hurt?"      Lower abdomine 2. RADIATION: "Does the pain shoot anywhere else?" (e.g., chest, back)     no 3. ONSET: "When did the pain begin?" (e.g., minutes, hours or days ago)     yesterday 4. SUDDEN: "Gradual or sudden onset?"    sudden 5. PATTERN "Does the pain come and go, or is it constant?"    - If constant: "Is it getting better, staying the same, or worsening?"      (Note: Constant means the pain never goes away completely; most serious pain is constant and it progresses)     - If intermittent: "How long does it last?" "Do you have pain now?"     (Note: Intermittent means the pain goes away completely between bouts)     Intermittent every 20 -30 minutes 6. SEVERITY: "How bad is the pain?"  (e.g., Scale 1-10; mild, moderate, or severe)   - MILD (1-3): doesn't interfere with normal activities, abdomen soft and not tender to touch    - MODERATE (4-7): interferes with normal activities or awakens from sleep, tender to touch    - SEVERE (8-10): excruciating pain, doubled over, unable to do any normal activities   severe 7. RECURRENT SYMPTOM: "Have you ever had this type of abdominal pain before?" If so, ask: "When was the last time?" and "What happened that time?"      1 year ago febuary 8. CAUSE: "What do you think is causing the abdominal pain?"     Gas possibly 9. RELIEVING/AGGRAVATING FACTORS: "What makes it better or worse?" (e.g., movement, antacids, bowel movement)     Baring down hurts more 10. OTHER SYMPTOMS: "Has there been any vomiting, diarrhea, constipation, or urine problems?"      Dry heaves 11. PREGNANCY: "Is there any chance you are pregnant?" "When was your last menstrual period?"      No  lo lo birth control No sex 3-4 months  Protocols used: ABDOMINAL PAIN - [facility]

## 2018-06-28 NOTE — Telephone Encounter (Signed)
Pt left v/m requesting cb today about pt message pt left earlier today.

## 2018-06-28 NOTE — Telephone Encounter (Signed)
I spoke with pt; pt said similar symptoms last year and still paying on ED visit. Gas X is not helping and when reviewed symptoms with pt she said if her condition worsened or saw more blood in stool she would go to ED tonight and pt will call 06/29/18 at 8 AM to see if Gentry Fitz NP would see pt. Instead of going to ED or UC.no covid symptoms and no travel or no known exposure to covid or flu. FYI to K clark NP,anc Chan CMA.

## 2018-06-29 ENCOUNTER — Ambulatory Visit
Admission: RE | Admit: 2018-06-29 | Discharge: 2018-06-29 | Disposition: A | Discharge status: Home / Self Care | Payer: 59 | Source: Ambulatory Visit | Attending: Primary Care | Admitting: Primary Care

## 2018-06-29 ENCOUNTER — Ambulatory Visit (INDEPENDENT_AMBULATORY_CARE_PROVIDER_SITE_OTHER): Payer: 59 | Admitting: Primary Care

## 2018-06-29 ENCOUNTER — Other Ambulatory Visit: Payer: Self-pay | Admitting: Primary Care

## 2018-06-29 ENCOUNTER — Encounter: Payer: Self-pay | Admitting: Primary Care

## 2018-06-29 ENCOUNTER — Other Ambulatory Visit: Payer: Self-pay

## 2018-06-29 DIAGNOSIS — R1030 Lower abdominal pain, unspecified: Secondary | ICD-10-CM

## 2018-06-29 LAB — CBC WITH DIFFERENTIAL/PLATELET
Basophils Absolute: 0 10*3/uL (ref 0.0–0.1)
Basophils Relative: 0.3 % (ref 0.0–3.0)
Eosinophils Absolute: 0.1 10*3/uL (ref 0.0–0.7)
Eosinophils Relative: 1.1 % (ref 0.0–5.0)
HCT: 44.2 % (ref 36.0–46.0)
Hemoglobin: 15.4 g/dL — ABNORMAL HIGH (ref 12.0–15.0)
Lymphocytes Relative: 20.3 % (ref 12.0–46.0)
Lymphs Abs: 1.7 10*3/uL (ref 0.7–4.0)
MCHC: 34.9 g/dL (ref 30.0–36.0)
MCV: 95 fl (ref 78.0–100.0)
Monocytes Absolute: 0.8 10*3/uL (ref 0.1–1.0)
Monocytes Relative: 8.9 % (ref 3.0–12.0)
Neutro Abs: 5.9 10*3/uL (ref 1.4–7.7)
Neutrophils Relative %: 69.4 % (ref 43.0–77.0)
Platelets: 242 10*3/uL (ref 150.0–400.0)
RBC: 4.65 Mil/uL (ref 3.87–5.11)
RDW: 12.9 % (ref 11.5–15.5)
WBC: 8.5 10*3/uL (ref 4.0–10.5)

## 2018-06-29 LAB — COMPREHENSIVE METABOLIC PANEL
ALT: 31 U/L (ref 0–35)
AST: 22 U/L (ref 0–37)
Albumin: 4.1 g/dL (ref 3.5–5.2)
Alkaline Phosphatase: 51 U/L (ref 39–117)
BUN: 14 mg/dL (ref 6–23)
CO2: 28 mEq/L (ref 19–32)
Calcium: 9.6 mg/dL (ref 8.4–10.5)
Chloride: 102 mEq/L (ref 96–112)
Creatinine, Ser: 1.17 mg/dL (ref 0.40–1.20)
GFR: 49.48 mL/min — ABNORMAL LOW (ref >60.00)
Glucose, Bld: 101 mg/dL — ABNORMAL HIGH (ref 70–99)
Potassium: 4.3 mEq/L (ref 3.5–5.1)
Sodium: 138 mEq/L (ref 135–145)
Total Bilirubin: 0.4 mg/dL (ref 0.2–1.2)
Total Protein: 7.4 g/dL (ref 6.0–8.3)

## 2018-06-29 IMAGING — CT CT ABDOMEN AND PELVIS WITH CONTRAST
1 of 3 series · 13 of 32 positions shown, 19 images · IV contrast (APPLIED)
Comparison: None.

CLINICAL DATA: Left lower quadrant pain

EXAM:
CT ABDOMEN AND PELVIS WITH CONTRAST
TECHNIQUE: Multidetector CT imaging of the abdomen and pelvis was performed
using the standard protocol following bolus administration of
intravenous contrast.
CONTRAST:  100mL [WU] IOPAMIDOL ([WU]) INJECTION 61%

[Series 2: abd/pelvis w/cm · axial · 0.71mm/px · z∈[-685,-300]mm · 13 of 91 slices shown, 19 images]
[im 7/91  soft-tissue]
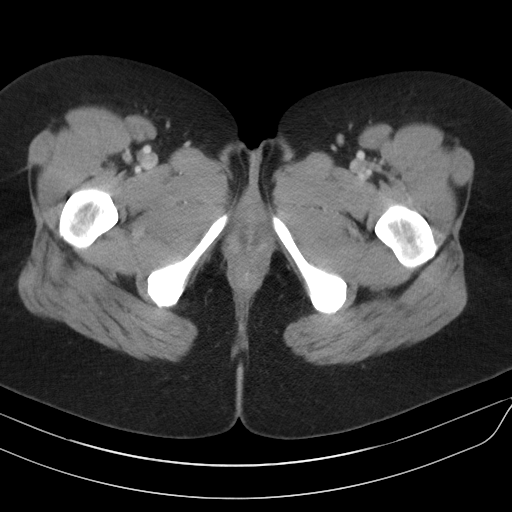
[im 7/91  bone]
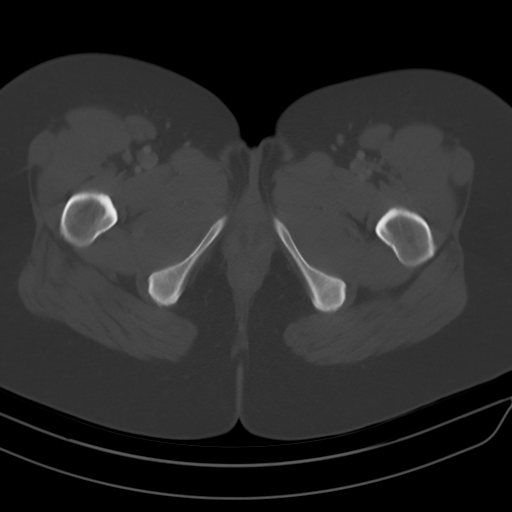
[im 13/91  soft-tissue]
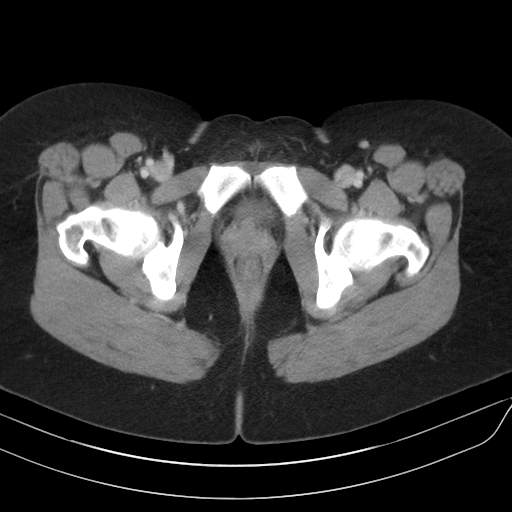
[im 20/91  soft-tissue]
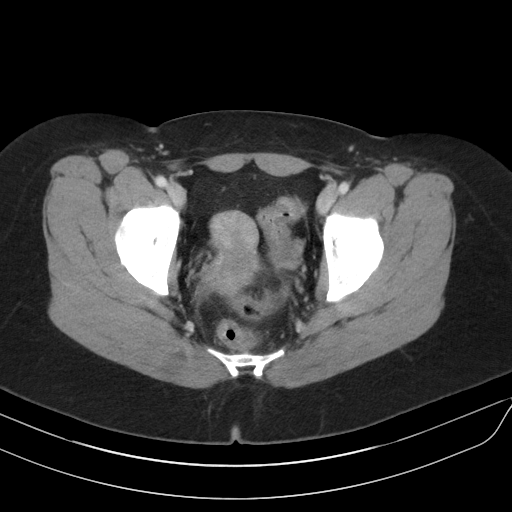
[im 26/91  soft-tissue]
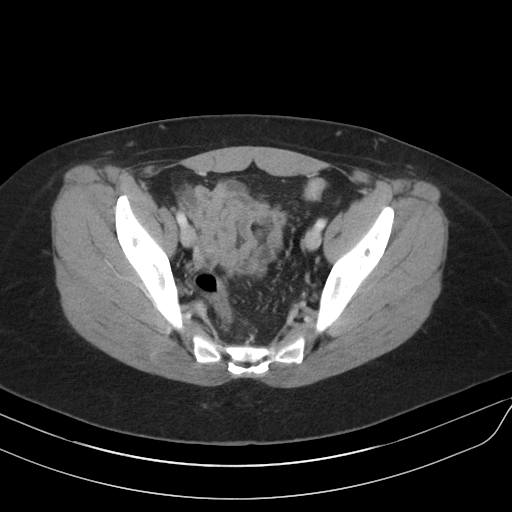
[im 33/91  soft-tissue]
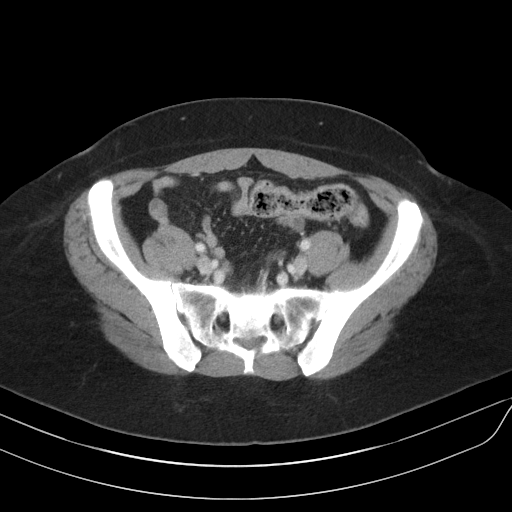
[im 39/91  soft-tissue]
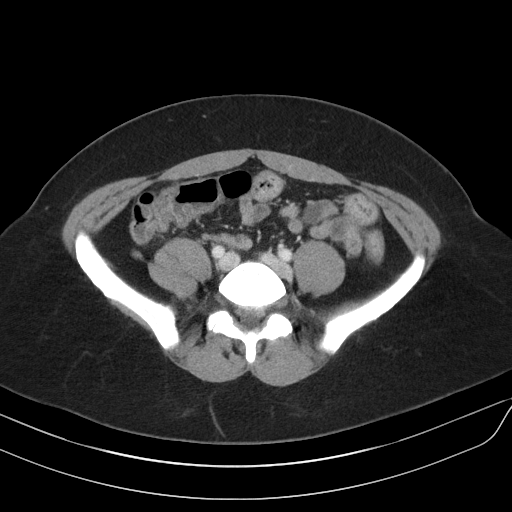
[im 46/91  soft-tissue]
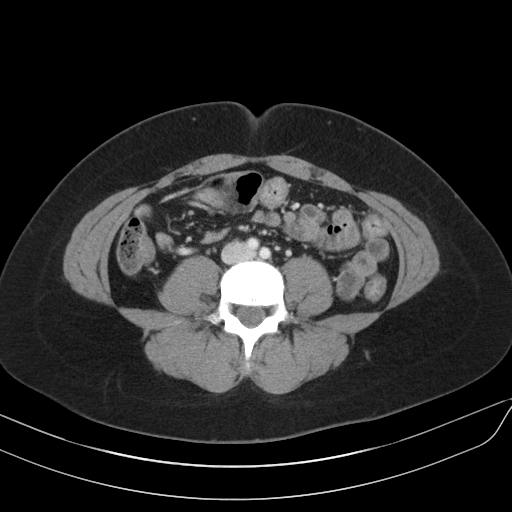
[im 52/91  soft-tissue]
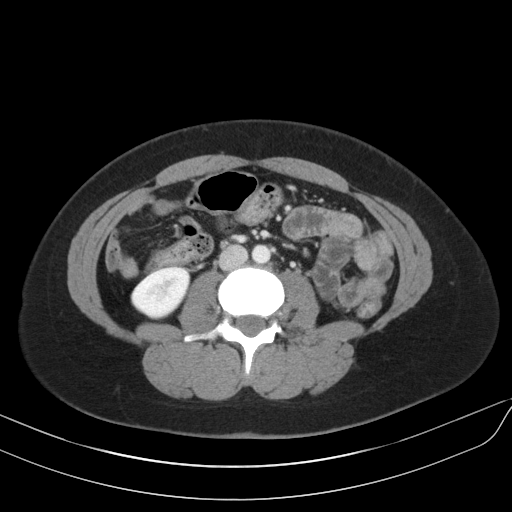
[im 58/91  soft-tissue]
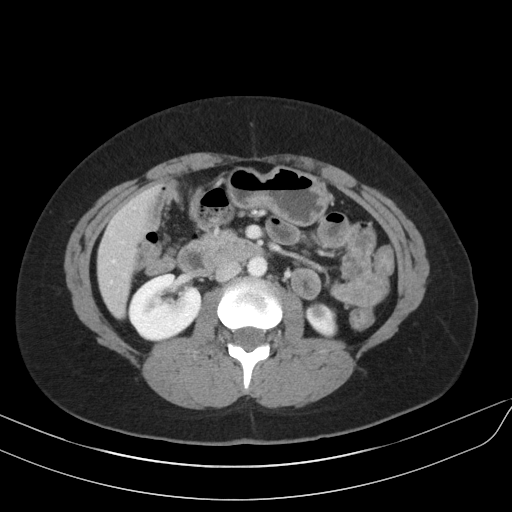
[im 58/91  bone]
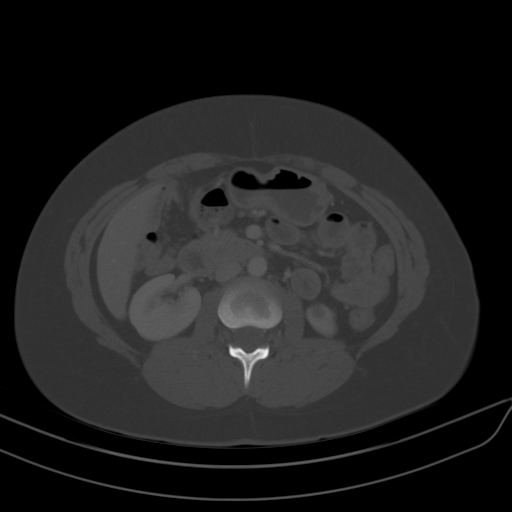
[im 65/91  soft-tissue]
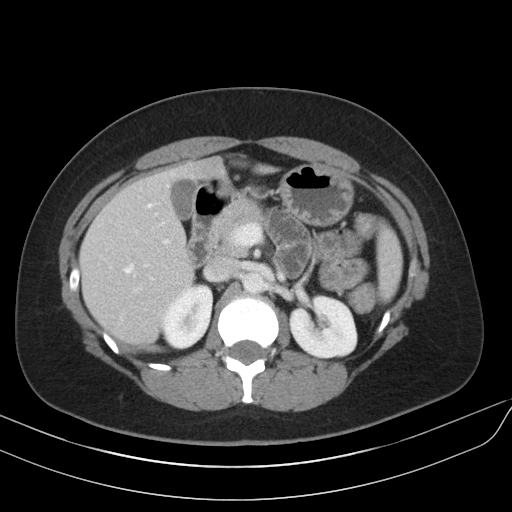
[im 65/91  lung]
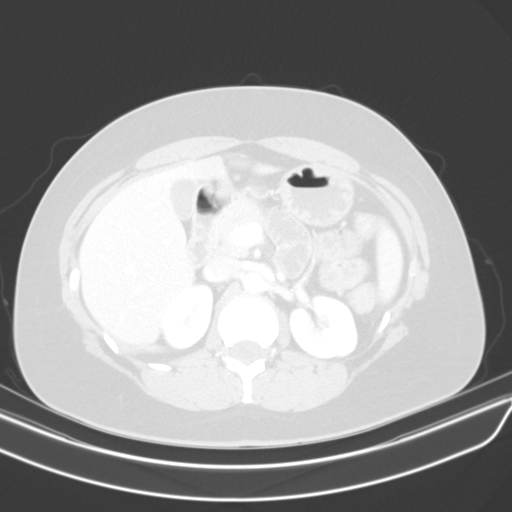
[im 71/91  soft-tissue]
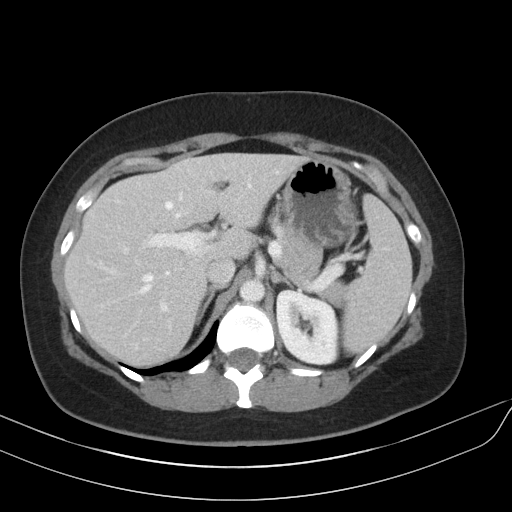
[im 71/91  lung]
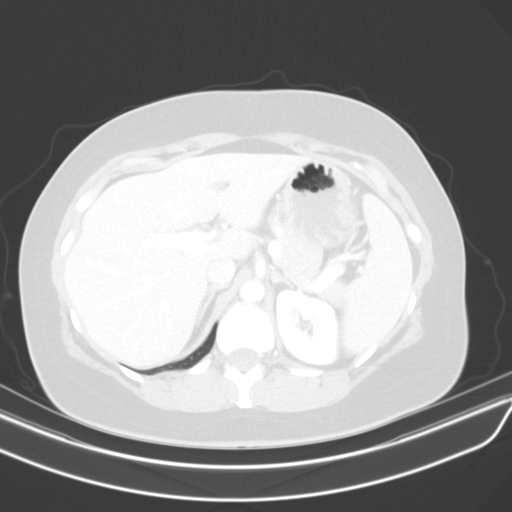
[im 78/91  soft-tissue]
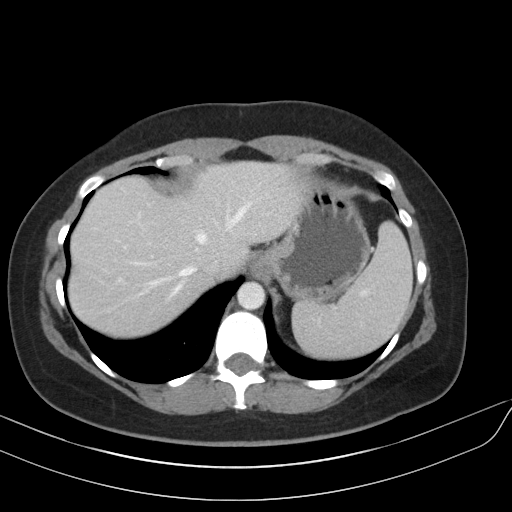
[im 78/91  lung]
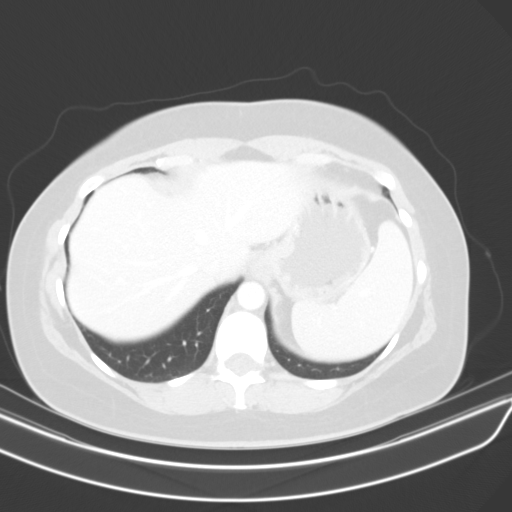
[im 84/91  soft-tissue]
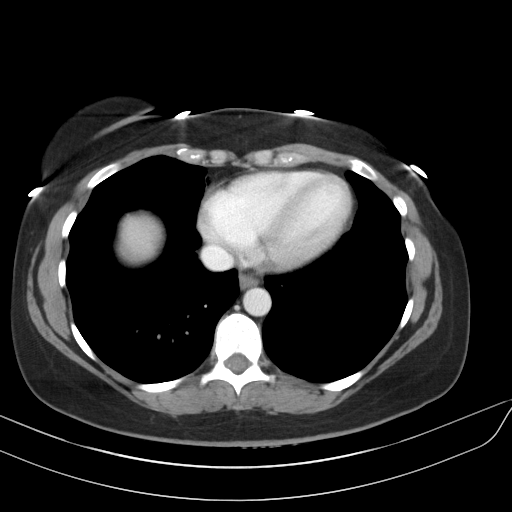
[im 84/91  lung]
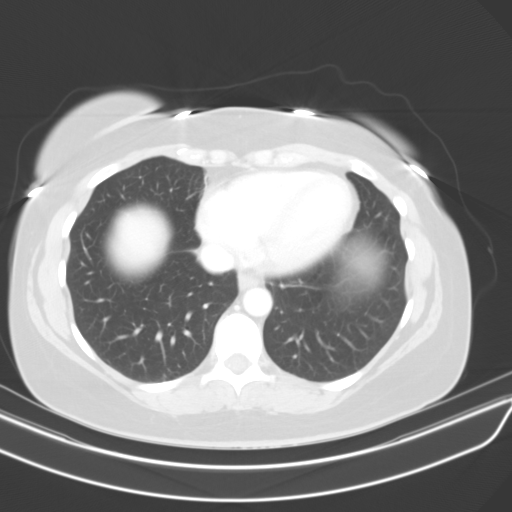

[13 of 32 positions shown; findings below may reference images not displayed]

FINDINGS: Lower chest: Lung bases are clear. No effusions. Heart is normal
size.

Hepatobiliary: No focal hepatic abnormality. Gallbladder
unremarkable.

Pancreas: No focal abnormality or ductal dilatation.

Spleen: No focal abnormality.  Normal size.

Adrenals/Urinary Tract: No adrenal abnormality. No focal renal
abnormality. No stones or hydronephrosis. Urinary bladder is
unremarkable.

Stomach/Bowel: There is mild diffuse wall thickening within the
sigmoid colon which is not well distended. No visible diverticula.
Stomach and small bowel decompressed, unremarkable. Cecum is located
in the right upper quadrant. Appendix not definitively seen.

Vascular/Lymphatic: No evidence of aneurysm or adenopathy.

Reproductive: Uterus and adnexa unremarkable.  No mass.

Other: No free fluid or free air.

Musculoskeletal: No acute bony abnormality.
IMPRESSION: Mild apparent wall thickening diffusely within the sigmoid colon.
This segment of colon is not well distended and this could be
related to nondistention. However, it is difficult to completely
exclude infectious or inflammatory colitis.

## 2018-06-29 MED ORDER — IOPAMIDOL (ISOVUE-300) INJECTION 61%
100.0000 mL | Freq: Once | INTRAVENOUS | Status: AC | PRN
  Administered 2018-06-29: 100 mL via INTRAVENOUS

## 2018-06-29 MED ORDER — METRONIDAZOLE 500 MG PO TABS: 500.0000 mg | ORAL_TABLET | Freq: Three times a day (TID) | ORAL | 0 refills | Status: DC

## 2018-06-29 MED ORDER — CIPROFLOXACIN HCL 250 MG PO TABS: 250.0000 mg | ORAL_TABLET | Freq: Two times a day (BID) | ORAL | 0 refills | Status: AC

## 2018-06-29 NOTE — Patient Instructions (Signed)
Stop by the lab prior to leaving today. I will notify you of your results once received.   Stop by the front desk and speak with either [facility] regarding your CT scan.  Work on a clear liquid diet moving forward until I get back in touch with you.  It was a pleasure to see you today!

## 2018-06-29 NOTE — Assessment & Plan Note (Signed)
Chronic for one year, intermittent since with significant pain over the last 2 days. Exam today with tenderness, she doesn't appear sickly.   Given symptoms coupled with presentation and family history of diverticulitis we will proceed with labs and CT abdomen pelvis to rule out diverticulitis. Stat CT ordered. Labs pending.  Discussed clear liquid diet and ED precautions provided. She appears stable for outpatient treatment.

## 2018-06-29 NOTE — Progress Notes (Signed)
Subjective:    Patient ID: Maria Mejia, female    DOB: 10/14/1970, 48 y.o.   MRN: [id number]  HPI  Maria Mejia is a 48 year old female who presents today with a chief complaint of abdominal pain.  She also reports diarrhea, bloody stools, nausea. Two days ago she began to have moderate bilateral abdominal pain with gas pain. She describes her pain as cramping with rectal pressure. She had several large bowel movements at the time her symptoms began. Symptoms occurred every 20-30 minutes for 4-5 hours then improved.   The following day her symptoms returned and were more severe. Symptoms include bilateral lower abdominal pain, sharp-gas like pain, nausea. This time she saw a small amount of bright red blood in the toilet bowel. Over the last 24 hours she's been eating toast with peanut butter, bananas, and soup broth. She denies fevers.   Today her symptoms are still present but are more mild. Cramping is still located to the bilateral lower abdomen. Her last bowel movement was about 45 minutes ago.   She has a family history of diverticulitis in her father. She had these same symptoms in February 2019, evaluated in the ED and underwent ultrasound which showed uterine fibroid and let ovarian cyst. She was offered a CT scan but she kindly declined. Recent symptoms feel very similar to the symptoms she had in February 2019.   Review of Systems  Constitutional: Negative for fever.  Gastrointestinal: Positive for abdominal pain, blood in stool, diarrhea and nausea.  Neurological: Negative for dizziness.       Past Medical History:  Diagnosis Date  . Headache   . Migraines      Social History   Socioeconomic History  . Marital status: Divorced    Spouse name: Not on file  . Number of children: Not on file  . Years of education: Not on file  . Highest education level: Not on file  Occupational History  . Not on file  Social Needs  . Financial resource strain: Not on file  . Food  insecurity:    Worry: Not on file    Inability: Not on file  . Transportation needs:    Medical: Not on file    Non-medical: Not on file  Tobacco Use  . Smoking status: Never Smoker  . Smokeless tobacco: Never Used  Substance and Sexual Activity  . Alcohol use: Yes    Alcohol/week: 1.0 standard drinks    Types: 1 Shots of liquor per week    Comment: occcasioally  . Drug use: No  . Sexual activity: Not on file  Lifestyle  . Physical activity:    Days per week: Not on file    Minutes per session: Not on file  . Stress: Not on file  Relationships  . Social connections:    Talks on phone: Not on file    Gets together: Not on file    Attends religious service: Not on file    Active member of club or organization: Not on file    Attends meetings of clubs or organizations: Not on file    Relationship status: Not on file  . Intimate partner violence:    Fear of current or ex partner: Not on file    Emotionally abused: Not on file    Physically abused: Not on file    Forced sexual activity: Not on file  Other Topics Concern  . Not on file  Social History Narrative  . Not  on file    No past surgical history on file.  Family History  Problem Relation Age of Onset  . Migraines Mother   . Stroke Mother   . Migraines Paternal Uncle   . Alzheimer's disease Maternal Grandfather   . Alzheimer's disease Paternal Grandfather     Allergies  Allergen Reactions  . Augmentin [Amoxicillin-Pot Clavulanate]     Upset stomach  . Erythromycin Other (See Comments)    Upset stomach  . Omnicef [Cefdinir]     Upset stomach   . Zithromax [Azithromycin]     Current Outpatient Medications on File Prior to Visit  Medication Sig Dispense Refill  . buPROPion (WELLBUTRIN XL) 150 MG 24 hr tablet Take 1 tablet (150 mg total) by mouth daily. 30 tablet 5  . dexlansoprazole (DEXILANT) 60 MG capsule Take 1 capsule (60 mg total) by mouth daily. 30 capsule 6  . dicyclomine (BENTYL) 10 MG capsule  TAKE ONE CAPSULE BY MOUTH THREE TIMES A DAY BEFORE MEALS AS NEEDED FOR IBS SYMPTOMS 30 capsule 0  . omeprazole (PRILOSEC) 20 MG capsule Take 1 capsule (20 mg total) by mouth daily. 90 capsule 0  . ondansetron (ZOFRAN ODT) 4 MG disintegrating tablet Take 1 tablet (4 mg total) by mouth every 8 (eight) hours as needed for nausea or vomiting. 20 tablet 0  . zolmitriptan (ZOMIG-ZMT) 5 MG disintegrating tablet TAKE ONE TABLET BY MOUTH AT ONSET OF HEADACHE; MAY REPEAT ONE TABLET IN 2 HOURS IF NEEDED. 9 tablet 0  . albuterol (PROVENTIL HFA;VENTOLIN HFA) 108 (90 Base) MCG/ACT inhaler Inhale 2 puffs into the lungs every 4 (four) hours as needed for wheezing or shortness of breath (cough, shortness of breath or wheezing.). (Patient not taking: Reported on 06/29/2018) 1 Inhaler 1   No current facility-administered medications on file prior to visit.     BP 122/84   Pulse 82   Temp 98.1 F (36.7 C) (Oral)   Ht 5\' 3"  (1.6 m)   Wt 180 lb 8 oz (81.9 kg)   SpO2 99%   BMI 31.97 kg/m    Objective:   Physical Exam  Constitutional: She appears well-nourished.  Cardiovascular: Normal rate.  Respiratory: Effort normal.  GI: Soft. Normal appearance. Bowel sounds are decreased. There is abdominal tenderness in the right lower quadrant, suprapubic area and left lower quadrant. There is no rebound, no guarding, no tenderness at McBurney's point and negative Murphy's sign.           Assessment & Plan:

## 2018-07-13 ENCOUNTER — Other Ambulatory Visit: Payer: Self-pay | Admitting: Primary Care

## 2018-07-13 DIAGNOSIS — G43701 Chronic migraine without aura, not intractable, with status migrainosus: Secondary | ICD-10-CM

## 2018-09-03 ENCOUNTER — Other Ambulatory Visit: Payer: Self-pay | Admitting: Primary Care

## 2018-09-03 DIAGNOSIS — G43701 Chronic migraine without aura, not intractable, with status migrainosus: Secondary | ICD-10-CM

## 2018-09-05 NOTE — Telephone Encounter (Signed)
Last prescribed on 07/13/2018. Last appointment on 06/29/2018. No future appointment

## 2018-09-05 NOTE — Telephone Encounter (Signed)
Refill sent to pharmacy.   

## 2018-09-24 ENCOUNTER — Ambulatory Visit: Payer: 59 | Admitting: Gastroenterology

## 2018-10-09 ENCOUNTER — Other Ambulatory Visit: Payer: Self-pay | Admitting: Primary Care

## 2018-10-09 DIAGNOSIS — F32A Depression, unspecified: Secondary | ICD-10-CM

## 2018-10-09 DIAGNOSIS — F329 Major depressive disorder, single episode, unspecified: Secondary | ICD-10-CM

## 2018-10-23 ENCOUNTER — Other Ambulatory Visit: Payer: Self-pay | Admitting: Primary Care

## 2018-10-23 DIAGNOSIS — G43701 Chronic migraine without aura, not intractable, with status migrainosus: Secondary | ICD-10-CM

## 2018-10-24 ENCOUNTER — Encounter: Payer: Self-pay | Admitting: Podiatry

## 2018-10-24 ENCOUNTER — Ambulatory Visit (INDEPENDENT_AMBULATORY_CARE_PROVIDER_SITE_OTHER): Payer: 59 | Admitting: Podiatry

## 2018-10-24 ENCOUNTER — Ambulatory Visit (INDEPENDENT_AMBULATORY_CARE_PROVIDER_SITE_OTHER): Payer: 59

## 2018-10-24 ENCOUNTER — Other Ambulatory Visit: Payer: Self-pay

## 2018-10-24 DIAGNOSIS — M722 Plantar fascial fibromatosis: Secondary | ICD-10-CM | POA: Diagnosis not present

## 2018-10-24 DIAGNOSIS — Z8249 Family history of ischemic heart disease and other diseases of the circulatory system: Secondary | ICD-10-CM

## 2018-10-24 DIAGNOSIS — G43009 Migraine without aura, not intractable, without status migrainosus: Secondary | ICD-10-CM

## 2018-10-24 MED ORDER — MELOXICAM 15 MG PO TABS: 15.0000 mg | ORAL_TABLET | Freq: Every day | ORAL | 3 refills | Status: DC

## 2018-10-24 MED ORDER — METHYLPREDNISOLONE 4 MG PO TBPK: ORAL_TABLET | ORAL | 0 refills | Status: DC

## 2018-10-24 NOTE — Telephone Encounter (Signed)
Last prescribed on 09/05/2018 . Last appointment on 06/29/2018 . No future appointment

## 2018-10-24 NOTE — Patient Instructions (Signed)

## 2018-10-25 ENCOUNTER — Encounter: Payer: Self-pay | Admitting: Podiatry

## 2018-10-25 NOTE — Progress Notes (Signed)
She presents today states that the bilateral heel pain she had a flareup of plantar fasciitis over the past 2 to 3 months.  She states that the right is worse than the left the pain is sharp it is not like needles in the heel.  She has been stretching and rolling the foot on the ball for treatment.  Objective: Vital signs are stable alert and oriented x3.  Pulses are palpable.  Neurologic sensorium is intact deep tendon reflexes are intact muscle strength is normal symmetrical.  She has pain on palpation medial calcaneal tubercle of the right heel.  There is no erythema edema cellulitis drainage odor radiographs confirm soft tissue increase in density plantar fascial cannula insertion site.  Assessment: Plan fasciitis right.  Plan: Injected the heel today after Betadine skin prep 20 g Kenalog 5 mg Marcaine question plantar fascial brace at night splint.  Placed her in on methylprednisolone and Mobic.  Follow-up with her in 1 month.

## 2018-11-08 DIAGNOSIS — F419 Anxiety disorder, unspecified: Secondary | ICD-10-CM

## 2018-11-08 DIAGNOSIS — F329 Major depressive disorder, single episode, unspecified: Secondary | ICD-10-CM

## 2018-11-08 MED ORDER — BUPROPION HCL ER (XL) 150 MG PO TB24: 150.0000 mg | ORAL_TABLET | Freq: Every day | ORAL | 0 refills | Status: DC

## 2018-11-13 NOTE — Progress Notes (Signed)
Virtual Visit via Video Note The purpose of this virtual visit is to provide medical care while limiting exposure to the novel coronavirus.    Consent was obtained for video visit:  Yes.   Answered questions that patient had about telehealth interaction:  Yes.   I discussed the limitations, risks, security and privacy concerns of performing an evaluation and management service by telemedicine. I also discussed with the patient that there may be a patient responsible charge related to this service. The patient expressed understanding and agreed to proceed.  Pt location: Home Physician Location: Home Name of referring provider:  Pleas Koch, NP I connected with Severiano Gilbert at patients initiation/request on 11/15/2018 at 10:30 AM EDT by video enabled telemedicine application and verified that I am speaking with the correct person using two identifiers. Pt MRN:  [id number] Pt DOB:  11/03/1970 Video Participants:  Severiano Gilbert   History of Present Illness:  Maria Mejia is a 48 year old female who presents for migraines and family history of cerebral aneurysm.  History supplemented by referring provider and prior neurologist notes.  MRI and CTA of brain described below were personally reviewed.  Onset:  Adolescence.  A year and a half ago, she started cosmetic Botox around the eyes that was effective for her migraines and was able to get off of topiramate.  She hasn't received cosmetic botox since prior to Covid-19 pandemic started. Location:  Right retro-orbital Quality:  pressure Intensity:   denies new headache, thunderclap headache Aura:  She once had an ocular migraine presenting as visual aura with flashing lights lasting 30 minutes.  No associated headache. Premonitory Phase:  no Postdrome:  Migraine hangover for 2 hours Associated symptoms:  Photophobia, phonophobia.  She rarely has nausea and vomiting.  She denies associated osmophobia or unilateral numbness or weakness. Duration:   If treated quickly, it lasts about an hour Frequency:  At least 9 migraines a month.  She reports more mild "sinus headaches" as well (total 15 headache days a month).  Frequency of abortive medication: varies Triggers:  Change in barometric pressure, artificial sweetener, peanut oil, caffeine withdrawal, menstrual cycle Relieving factors:  Caffeine only if with caffeine withdrawal  Activity:  Aggravates but able to function  She has family history of cerebral aneurysm.  Mother passed away from aneurysm rupture in 2001.  Her mom's brother and sister have aneurysms as well.    08/20/2015 MRI Brain with and without:  2 punctate nonenhancing nonspecific hyperintense foci in the right frontal white matter 09/18/2015 CTA Head:  No aneurysm or other vascular abnormality.  Rescue protocol:  Zomig for migraine; Zyrtec or Cold and Sinus medication for sinus headache Current NSAIDS:  ibuprofen Current analgesics:  none Current triptans:  Zomig-ZMT 5mg  Current ergotamine:  none Current anti-emetic:  none Current muscle relaxants:  none Current anti-anxiolytic:  none Current sleep aide:  none Current Antihypertensive medications:  none Current Antidepressant medications:  Wellbutrin XL Current Anticonvulsant medications:  none Current anti-CGRP:  none Current Vitamins/Herbal/Supplements:  none Current Antihistamines/Decongestants:  Zyrtec Other therapy:  none Hormone/birth control:  Lo Loestrin Fe  Past NSAIDS:  Ibuprofen, naproxen Past analgesics:  Tylenol, Excedrin Past abortive triptans:  Sumatriptan, rizatriptan Past abortive ergotamine:  none Past muscle relaxants:  none Past anti-emetic:  Zofran ODT 4mg  Past antihypertensive medications:  none Past antidepressant medications:  none Past anticonvulsant medications:  topiramate 100mg  twice daily Past anti-CGRP:  none Past vitamins/Herbal/Supplements:  none Other past therapies:  none  Caffeine:  Rarely drinks coffee.  Drinks 1 energy  drink a day. Diet:  At least 1/2 gallon water daily. Exercise:  yes Depression:  mild; Anxiety:  no Other pain:  no Sleep hygiene:  Interrupted.  Family history of headache:  Mom (migraines); maternal uncle (migraines as young adult)  Past Medical History: Past Medical History:  Diagnosis Date  . Headache   . Migraines     Medications: Outpatient Encounter Medications as of 11/15/2018  Medication Sig  . buPROPion (WELLBUTRIN XL) 150 MG 24 hr tablet Take 1 tablet (150 mg total) by mouth daily. NEED  A FOLLOW UP APPOINTMENT FOR ANY MORE REFILLS  . Cetirizine HCl (ZYRTEC ALLERGY) 10 MG CAPS Zyrtec 10 mg capsule  Take by oral route.  . dicyclomine (BENTYL) 10 MG capsule TAKE ONE CAPSULE BY MOUTH THREE TIMES A DAY BEFORE MEALS AS NEEDED FOR IBS SYMPTOMS  . LO LOESTRIN FE 1 MG-10 MCG / 10 MCG tablet   . meloxicam (MOBIC) 15 MG tablet Take 1 tablet (15 mg total) by mouth daily.  . methylPREDNISolone (MEDROL DOSEPAK) 4 MG TBPK tablet 6 day dose pack - take as directed  . omeprazole (PRILOSEC) 20 MG capsule Take 1 capsule (20 mg total) by mouth daily.  Marland Kitchen zolmitriptan (ZOMIG-ZMT) 5 MG disintegrating tablet TAKE ONE TABLET BY MOUTH AT ONSET OF HEADACHE; MAY REPEAT ONE TABLET IN 2 HOURS IF NEEDED.   No facility-administered encounter medications on file as of 11/15/2018.     Allergies: Allergies  Allergen Reactions  . Augmentin [Amoxicillin-Pot Clavulanate]     Upset stomach  . Erythromycin Other (See Comments)    Upset stomach  . Omnicef [Cefdinir]     Upset stomach   . Zithromax [Azithromycin]     Family History: Family History  Problem Relation Age of Onset  . Migraines Mother   . Stroke Mother   . Migraines Paternal Uncle   . Alzheimer's disease Maternal Grandfather   . Alzheimer's disease Paternal Grandfather     Social History: Social History   Socioeconomic History  . Marital status: Divorced    Spouse name: Not on file  . Number of children: Not on file  .  Years of education: Not on file  . Highest education level: Not on file  Occupational History  . Not on file  Social Needs  . Financial resource strain: Not on file  . Food insecurity    Worry: Not on file    Inability: Not on file  . Transportation needs    Medical: Not on file    Non-medical: Not on file  Tobacco Use  . Smoking status: Never Smoker  . Smokeless tobacco: Never Used  Substance and Sexual Activity  . Alcohol use: Yes    Alcohol/week: 1.0 standard drinks    Types: 1 Shots of liquor per week    Comment: occcasioally  . Drug use: No  . Sexual activity: Not on file  Lifestyle  . Physical activity    Days per week: Not on file    Minutes per session: Not on file  . Stress: Not on file  Relationships  . Social Herbalist on phone: Not on file    Gets together: Not on file    Attends religious service: Not on file    Active member of club or organization: Not on file    Attends meetings of clubs or organizations: Not on file    Relationship status: Not on file  . Intimate  partner violence    Fear of current or ex partner: Not on file    Emotionally abused: Not on file    Physically abused: Not on file    Forced sexual activity: Not on file  Other Topics Concern  . Not on file  Social History Narrative  . Not on file    Observations/Objective:   Height 5\' 3"  (1.6 m), weight 179 lb (81.2 kg). No acute distress.  Alert and oriented.  Speech fluent and not dysarthric.  Language intact.  Eyes orthophoric on primary gaze.  Face symmetric.  Assessment and Plan:   1.  Chronic migraine without aura, without status migrainosus, not intractable 2.  Family history of cerebral aneurysm with no.  I would repeat MRA of head in 2 years (5 years from prior imaging)  1.  For preventative management, start Emgality 2.  For abortive therapy, refilled Zomig 3.  Limit use of pain relievers to no more than 2 days out of week to prevent risk of rebound or  medication-overuse headache. 4.  Keep headache diary 5.  Exercise, hydration, caffeine cessation, sleep hygiene, monitor for and avoid triggers 6.  Consider:  magnesium citrate 400mg  daily, riboflavin 400mg  daily, and coenzyme Q10 100mg  three times daily 7. Always keep in mind that currently taking a hormone or birth control may be a possible trigger or aggravating factor for migraine. 8. Follow up 4 months   Follow Up Instructions:    -I discussed the assessment and treatment plan with the patient. The patient was provided an opportunity to ask questions and all were answered. The patient agreed with the plan and demonstrated an understanding of the instructions.   The patient was advised to call back or seek an in-person evaluation if the symptoms worsen or if the condition fails to improve as anticipated.   Dudley Major, DO

## 2018-11-15 ENCOUNTER — Other Ambulatory Visit: Payer: Self-pay

## 2018-11-15 ENCOUNTER — Encounter: Payer: Self-pay | Admitting: Neurology

## 2018-11-15 ENCOUNTER — Telehealth (INDEPENDENT_AMBULATORY_CARE_PROVIDER_SITE_OTHER): Payer: 59 | Admitting: Neurology

## 2018-11-15 DIAGNOSIS — G43701 Chronic migraine without aura, not intractable, with status migrainosus: Secondary | ICD-10-CM | POA: Diagnosis not present

## 2018-11-15 MED ORDER — ZOLMITRIPTAN 5 MG PO TBDP: ORAL_TABLET | ORAL | 3 refills | Status: DC

## 2018-11-15 MED ORDER — EMGALITY 120 MG/ML ~~LOC~~ SOAJ: 240.0000 mg | Freq: Once | SUBCUTANEOUS | 0 refills | Status: AC

## 2018-11-15 MED ORDER — EMGALITY 120 MG/ML ~~LOC~~ SOAJ: 120.0000 mg | SUBCUTANEOUS | 11 refills | Status: DC

## 2018-11-15 NOTE — Patient Instructions (Signed)
1.  Start Emgality every 30 days 2.  Continue Zomig as needed 3.  Limit use of pain relievers to no more than 2 days out of week to prevent risk of rebound or medication-overuse headache. 4.  Keep headache diary 5.  Exercise, hydration, caffeine cessation, sleep hygiene, monitor for and avoid triggers 6.  Consider:  magnesium citrate 400mg  daily, riboflavin 400mg  daily, and coenzyme Q10 100mg  three times daily 7. Always keep in mind that currently taking a hormone or birth control may be a possible trigger or aggravating factor for migraine. 8. Repeat imaging of head in 2 years 9. Follow up in 4 months

## 2018-11-19 ENCOUNTER — Encounter: Payer: Self-pay | Admitting: Primary Care

## 2018-11-19 ENCOUNTER — Other Ambulatory Visit: Payer: Self-pay

## 2018-11-19 ENCOUNTER — Ambulatory Visit (INDEPENDENT_AMBULATORY_CARE_PROVIDER_SITE_OTHER): Payer: 59 | Admitting: Primary Care

## 2018-11-19 VITALS — BP 124/84 | HR 88 | Temp 98.0°F | Ht 63.0 in | Wt 184.0 lb

## 2018-11-19 DIAGNOSIS — G43009 Migraine without aura, not intractable, without status migrainosus: Secondary | ICD-10-CM | POA: Diagnosis not present

## 2018-11-19 DIAGNOSIS — E781 Pure hyperglyceridemia: Secondary | ICD-10-CM

## 2018-11-19 DIAGNOSIS — F32A Depression, unspecified: Secondary | ICD-10-CM

## 2018-11-19 DIAGNOSIS — F329 Major depressive disorder, single episode, unspecified: Secondary | ICD-10-CM

## 2018-11-19 DIAGNOSIS — Z23 Encounter for immunization: Secondary | ICD-10-CM | POA: Diagnosis not present

## 2018-11-19 DIAGNOSIS — F419 Anxiety disorder, unspecified: Secondary | ICD-10-CM

## 2018-11-19 MED ORDER — BUPROPION HCL ER (XL) 150 MG PO TB24: 150.0000 mg | ORAL_TABLET | Freq: Every day | ORAL | 3 refills | Status: DC

## 2018-11-19 NOTE — Patient Instructions (Addendum)
Stop by the lab prior to leaving today. I will notify you of your results once received.   Be sure to exercise. You should be getting 150 minutes of moderate intensity exercise weekly.  It's important to eat a healthy diet by reducing consumption of fast food, fried food, processed snack foods, sugary drinks. Increase consumption of fresh vegetables and fruits, whole grains, water.  Ensure you are drinking 64 ounces of water daily.  It was a pleasure to see you today!

## 2018-11-19 NOTE — Progress Notes (Signed)
Subjective:    Patient ID: Maria Mejia, female    DOB: [birth year], 48 y.o.   MRN: [id number]  HPI  Ms. Thiede is a 48 year old female who presents today for follow up of chronic conditions.  1) Migraines/Headaches: Currently managed on Emgality 120 mg weekly, Zomig-ZMT PRN, mostly 9 pills monthly. Currently following with neurology. She may undergo Botox but will have to try a daily preventative medication first.   2) Anxiety and Depression: Currently managed on bupropion ER 150 mg.  3) Hyperlipidemia: No recent lipid panel on file.  4) Decreased Renal Function: GFR of 49.48 with creatinine of 1.17 in May 2020. This was a decrease in function compared to labs in February 2019 with GFR of 59 and creatinine of 1.10.  She is managed on Meloxicam for which is provided by her podiatrist, has been taking for the last two weeks. Also historically taken a lot of Ibuprofen. She is drinking plenty of water daily.   BP Readings from Last 3 Encounters:  11/19/18 124/84  06/29/18 122/84  02/15/18 125/79     Review of Systems  Respiratory: Negative for shortness of breath.   Cardiovascular: Negative for chest pain.  Neurological: Positive for headaches.  Psychiatric/Behavioral: The patient is not nervous/anxious.        Doing well on Wellbutrin XL, continue same.       Past Medical History:  Diagnosis Date  . Headache   . Migraines      Social History   Socioeconomic History  . Marital status: Divorced    Spouse name: Not on file  . Number of children: 0  . Years of education: Not on file  . Highest education level: Not on file  Occupational History  . Not on file  Social Needs  . Financial resource strain: Not on file  . Food insecurity    Worry: Not on file    Inability: Not on file  . Transportation needs    Medical: Not on file    Non-medical: Not on file  Tobacco Use  . Smoking status: Never Smoker  . Smokeless tobacco: Never Used  Substance and Sexual Activity  .  Alcohol use: Yes    Alcohol/week: 1.0 standard drinks    Types: 1 Shots of liquor per week    Comment: occcasioally  . Drug use: No  . Sexual activity: Yes    Partners: Male    Birth control/protection: Pill  Lifestyle  . Physical activity    Days per week: Not on file    Minutes per session: Not on file  . Stress: Not on file  Relationships  . Social Herbalist on phone: Not on file    Gets together: Not on file    Attends religious service: Not on file    Active member of club or organization: Not on file    Attends meetings of clubs or organizations: Not on file    Relationship status: Not on file  . Intimate partner violence    Fear of current or ex partner: Not on file    Emotionally abused: Not on file    Physically abused: Not on file    Forced sexual activity: Not on file  Other Topics Concern  . Not on file  Social History Narrative   Right handed       HS graduate      Two story home          Past Surgical  History:  Procedure Laterality Date  . ARTHOSCOPIC ROTAOR CUFF REPAIR Left   . BREAST REDUCTION SURGERY  2009  . NASAL SEPTUM SURGERY      Family History  Problem Relation Age of Onset  . Migraines Mother   . Stroke Mother   . Migraines Paternal Uncle   . Alzheimer's disease Maternal Grandfather   . Alzheimer's disease Paternal Grandfather     Allergies  Allergen Reactions  . Augmentin [Amoxicillin-Pot Clavulanate]     Upset stomach  . Erythromycin Other (See Comments)    Upset stomach  . Omnicef [Cefdinir]     Upset stomach   . Zithromax [Azithromycin]     Current Outpatient Medications on File Prior to Visit  Medication Sig Dispense Refill  . dicyclomine (BENTYL) 10 MG capsule TAKE ONE CAPSULE BY MOUTH THREE TIMES A DAY BEFORE MEALS AS NEEDED FOR IBS SYMPTOMS 30 capsule 0  . Galcanezumab-gnlm (EMGALITY) 120 MG/ML SOAJ Inject 120 mg into the skin every 30 (thirty) days. 1 pen 11  . levocetirizine (XYZAL) 5 MG tablet Take 5  mg by mouth every evening.    . LO LOESTRIN FE 1 MG-10 MCG / 10 MCG tablet     . meloxicam (MOBIC) 15 MG tablet Take 1 tablet (15 mg total) by mouth daily. 30 tablet 3  . omeprazole (PRILOSEC) 20 MG capsule Take 1 capsule (20 mg total) by mouth daily. 90 capsule 0  . zolmitriptan (ZOMIG-ZMT) 5 MG disintegrating tablet Take 1 tablet earliest onset of migraine.  May repeat in 2 hours if needed. Maximum 2 tablets in 24 hours 9 tablet 3   No current facility-administered medications on file prior to visit.     BP 124/84   Pulse 88   Temp 98 F (36.7 C) (Temporal)   Ht 5\' 3"  (1.6 m)   Wt 184 lb (83.5 kg)   SpO2 99%   BMI 32.59 kg/m    Objective:   Physical Exam  Constitutional: She appears well-nourished.  Neck: Neck supple.  Cardiovascular: Normal rate and regular rhythm.  Respiratory: Effort normal and breath sounds normal.  Skin: Skin is warm and dry.  Psychiatric: She has a normal mood and affect.           Assessment & Plan:

## 2018-11-19 NOTE — Assessment & Plan Note (Signed)
Following with neurology, will be starting on Emgality weekly. Continue current regimen.

## 2018-11-19 NOTE — Assessment & Plan Note (Signed)
Repeat lipids pending. Encouraged continued exercise, healthy diet.

## 2018-11-19 NOTE — Assessment & Plan Note (Signed)
Doing well on current regimen of Wellbutrin XL 150 mg. Continue same. Refills provided.

## 2018-11-19 NOTE — Assessment & Plan Note (Signed)
Doing well on daily omeprazole 20 mg, continue same. 

## 2018-11-19 NOTE — Addendum Note (Signed)
Addended by: Jacqualin Combes on: 11/19/2018 04:52 PM   Modules accepted: Orders

## 2018-11-20 LAB — LIPID PANEL
Cholesterol: 170 mg/dL (ref 0–200)
HDL: 45.1 mg/dL (ref >39.00)
LDL Cholesterol: 98 mg/dL (ref 0–99)
NonHDL: 124.82
Total CHOL/HDL Ratio: 4
Triglycerides: 136 mg/dL (ref 0.0–149.0)
VLDL: 27.2 mg/dL (ref 0.0–40.0)

## 2018-11-20 LAB — HEMOGLOBIN A1C: Hgb A1c MFr Bld: 5.6 % (ref 4.6–6.5)

## 2018-11-20 LAB — COMPREHENSIVE METABOLIC PANEL
ALT: 14 U/L (ref 0–35)
AST: 15 U/L (ref 0–37)
Albumin: 4.4 g/dL (ref 3.5–5.2)
Alkaline Phosphatase: 57 U/L (ref 39–117)
BUN: 16 mg/dL (ref 6–23)
CO2: 30 mEq/L (ref 19–32)
Calcium: 9.4 mg/dL (ref 8.4–10.5)
Chloride: 103 mEq/L (ref 96–112)
Creatinine, Ser: 1.01 mg/dL (ref 0.40–1.20)
GFR: 58.53 mL/min — ABNORMAL LOW (ref >60.00)
Glucose, Bld: 90 mg/dL (ref 70–99)
Potassium: 4 mEq/L (ref 3.5–5.1)
Sodium: 139 mEq/L (ref 135–145)
Total Bilirubin: 0.5 mg/dL (ref 0.2–1.2)
Total Protein: 6.8 g/dL (ref 6.0–8.3)

## 2018-11-20 LAB — CBC
HCT: 41.9 % (ref 36.0–46.0)
Hemoglobin: 14.3 g/dL (ref 12.0–15.0)
MCHC: 34.1 g/dL (ref 30.0–36.0)
MCV: 96.5 fl (ref 78.0–100.0)
Platelets: 274 10*3/uL (ref 150.0–400.0)
RBC: 4.34 Mil/uL (ref 3.87–5.11)
RDW: 12.9 % (ref 11.5–15.5)
WBC: 10.3 10*3/uL (ref 4.0–10.5)

## 2018-11-26 ENCOUNTER — Ambulatory Visit: Payer: 59 | Admitting: Podiatry

## 2019-01-07 ENCOUNTER — Ambulatory Visit: Payer: 59 | Admitting: Podiatry

## 2019-01-08 ENCOUNTER — Other Ambulatory Visit: Payer: Self-pay

## 2019-01-08 ENCOUNTER — Ambulatory Visit (INDEPENDENT_AMBULATORY_CARE_PROVIDER_SITE_OTHER): Payer: 59 | Admitting: Podiatry

## 2019-01-08 DIAGNOSIS — M722 Plantar fascial fibromatosis: Secondary | ICD-10-CM | POA: Diagnosis not present

## 2019-01-08 DIAGNOSIS — M79671 Pain in right foot: Secondary | ICD-10-CM

## 2019-01-09 ENCOUNTER — Encounter: Payer: Self-pay | Admitting: Podiatry

## 2019-01-09 NOTE — Progress Notes (Signed)
Subjective:  Patient ID: Maria Mejia, female    DOB: 01/21/1971,  MRN: [id number]  Chief Complaint  Patient presents with  . Plantar Fasciitis    pt is here for a f/u of plantar fasciitis of the right foot, pt states that the right foot, is not getting much better, pain is elevated when applying pressure, pt also states that the right plantar fascial brace is too big and is requesting another one    48 y.o. female presents with the above complaint.  Patient states that the right plantar fascial is not getting any better.  Patient states that it is still hurting on the right side.  The injection that was given last him did help for a little bit but she also failed to follow-up 1 month afterward.  As it was getting better.  She states she is also looking for smaller plantar fascial braces last one that was given was too big.  She states the anti-inflammatory has not helped as well.  She denies any other acute complaints.  She has been doing her stretching exercises.   Review of Systems: Negative except as noted in the HPI. Denies N/V/F/Ch.  Past Medical History:  Diagnosis Date  . Headache   . Migraines     Current Outpatient Medications:  .  buPROPion (WELLBUTRIN XL) 150 MG 24 hr tablet, Take 1 tablet (150 mg total) by mouth daily., Disp: 90 tablet, Rfl: 3 .  dicyclomine (BENTYL) 10 MG capsule, TAKE ONE CAPSULE BY MOUTH THREE TIMES A DAY BEFORE MEALS AS NEEDED FOR IBS SYMPTOMS, Disp: 30 capsule, Rfl: 0 .  Galcanezumab-gnlm (EMGALITY) 120 MG/ML SOAJ, Inject 120 mg into the skin every 30 (thirty) days., Disp: 1 pen, Rfl: 11 .  levocetirizine (XYZAL) 5 MG tablet, Take 5 mg by mouth every evening., Disp: , Rfl:  .  LO LOESTRIN FE 1 MG-10 MCG / 10 MCG tablet, , Disp: , Rfl:  .  meloxicam (MOBIC) 15 MG tablet, Take 1 tablet (15 mg total) by mouth daily., Disp: 30 tablet, Rfl: 3 .  omeprazole (PRILOSEC) 20 MG capsule, Take 1 capsule (20 mg total) by mouth daily., Disp: 90 capsule, Rfl: 0 .   zolmitriptan (ZOMIG-ZMT) 5 MG disintegrating tablet, Take 1 tablet earliest onset of migraine.  May repeat in 2 hours if needed. Maximum 2 tablets in 24 hours, Disp: 9 tablet, Rfl: 3  Social History   Tobacco Use  Smoking Status Never Smoker  Smokeless Tobacco Never Used    Allergies  Allergen Reactions  . Augmentin [Amoxicillin-Pot Clavulanate]     Upset stomach  . Erythromycin Other (See Comments)    Upset stomach  . Omnicef [Cefdinir]     Upset stomach   . Zithromax [Azithromycin]    Objective:  There were no vitals filed for this visit. There is no height or weight on file to calculate BMI. Constitutional Well developed. Well nourished.  Vascular Dorsalis pedis pulses palpable bilaterally. Posterior tibial pulses palpable bilaterally. Capillary refill normal to all digits.  No cyanosis or clubbing noted. Pedal hair growth normal.  Neurologic Normal speech. Oriented to person, place, and time. Epicritic sensation to light touch grossly present bilaterally.  Dermatologic Nails well groomed and normal in appearance. No open wounds. No skin lesions.  Orthopedic: Normal joint ROM without pain or crepitus bilaterally. No visible deformities. Tender to palpation at the calcaneal tuber right. No pain with calcaneal squeeze right. Ankle ROM diminished range of motion right. Silfverskiold Test: negative right.   Radiographs:  None  Assessment:  No diagnosis found. Plan:  Patient was evaluated and treated and all questions answered.  Plantar Fasciitis, right -No new x-rays taken - Educated on icing and stretching. Instructions given.  - Injection delivered to the plantar fascia as below. - DME: Smaller size plantar fascial brace was dispensed - Pharmacologic management: None. Educated on risks/benefits and proper taking of medication.  Flexible pes planus -I explained to the patient the etiology of pes planus and all the treatment options associated with that  including custom-made orthotics.  Given that patient has tried over-the-counter orthotic has not helped much, I believe patient will benefit from custom-made orthotics.  Patient will be scheduled to see Liliane Channel to have them made.  Procedure: Injection Tendon/Ligament Location: Right plantar fascia at the glabrous junction; medial approach. Skin Prep: alcohol Injectate: 0.5 cc 0.5% marcaine plain, 0.5 cc of 1% Lidocaine, 0.5 cc kenalog 10. Disposition: Patient tolerated procedure well. Injection site dressed with a band-aid.  No follow-ups on file.

## 2019-01-11 ENCOUNTER — Ambulatory Visit (INDEPENDENT_AMBULATORY_CARE_PROVIDER_SITE_OTHER): Payer: 59 | Admitting: Orthotics

## 2019-01-11 ENCOUNTER — Other Ambulatory Visit: Payer: Self-pay

## 2019-01-11 DIAGNOSIS — M722 Plantar fascial fibromatosis: Secondary | ICD-10-CM | POA: Diagnosis not present

## 2019-01-11 NOTE — Progress Notes (Signed)

## 2019-02-06 ENCOUNTER — Ambulatory Visit: Payer: 59 | Admitting: Orthotics

## 2019-02-06 ENCOUNTER — Other Ambulatory Visit: Payer: Self-pay

## 2019-02-06 DIAGNOSIS — M722 Plantar fascial fibromatosis: Secondary | ICD-10-CM

## 2019-02-06 DIAGNOSIS — Z0001 Encounter for general adult medical examination with abnormal findings: Secondary | ICD-10-CM

## 2019-02-06 DIAGNOSIS — M79671 Pain in right foot: Secondary | ICD-10-CM

## 2019-02-06 NOTE — Progress Notes (Signed)
Patient came in today to pick up custom made foot orthotics.  The goals were accomplished and the patient reported no dissatisfaction with said orthotics.  Patient was advised of breakin period and how to report any issues. 

## 2019-02-12 ENCOUNTER — Ambulatory Visit: Payer: 59 | Admitting: Podiatry

## 2019-03-07 ENCOUNTER — Encounter: Payer: Self-pay | Admitting: Podiatry

## 2019-03-07 ENCOUNTER — Other Ambulatory Visit: Payer: Self-pay

## 2019-03-07 ENCOUNTER — Ambulatory Visit (INDEPENDENT_AMBULATORY_CARE_PROVIDER_SITE_OTHER): Payer: 59 | Admitting: Podiatry

## 2019-03-07 DIAGNOSIS — M2141 Flat foot [pes planus] (acquired), right foot: Secondary | ICD-10-CM | POA: Diagnosis not present

## 2019-03-07 DIAGNOSIS — B351 Tinea unguium: Secondary | ICD-10-CM | POA: Diagnosis not present

## 2019-03-07 DIAGNOSIS — M722 Plantar fascial fibromatosis: Secondary | ICD-10-CM

## 2019-03-07 DIAGNOSIS — M2142 Flat foot [pes planus] (acquired), left foot: Secondary | ICD-10-CM

## 2019-03-07 DIAGNOSIS — M79671 Pain in right foot: Secondary | ICD-10-CM | POA: Diagnosis not present

## 2019-03-08 ENCOUNTER — Encounter: Payer: Self-pay | Admitting: Podiatry

## 2019-03-08 NOTE — Progress Notes (Signed)
Subjective:  Patient ID: Maria Mejia, female    DOB: Nov 30, 1970,  MRN: [id number]  Chief Complaint  Patient presents with  . Plantar Fasciitis    "its better but still hurts in the heel.  It feels like its bruised"  . Nail Problem    She c/o nail fungus left great toenail x years   Wants to discuss treatment    49 y.o. female presents with the above complaint.  Patient states that the right plantar fascial is not getting any better.  Patient states that it is still hurting on the right side.  Patient states that the pain is just not getting better.  Patient is also wants to discuss the left hallux nail fungus has been going on for years.  She denies any other acute complaints.  She would like to discuss treatment options for this.  In terms of her plantar fasciitis pain on the right side she has been doing her stretching exercises the injection and the bracing has not helped.  She states the orthotics does help a little bit.  She also has a father who been diagnosed with cancer and will need to go up [place] to possibly take care of him.  She denies any other acute complaints.  Review of Systems: Negative except as noted in the HPI. Denies N/V/F/Ch.  Past Medical History:  Diagnosis Date  . Headache   . Migraines     Current Outpatient Medications:  .  buPROPion (WELLBUTRIN XL) 150 MG 24 hr tablet, Take 1 tablet (150 mg total) by mouth daily., Disp: 90 tablet, Rfl: 3 .  dicyclomine (BENTYL) 10 MG capsule, TAKE ONE CAPSULE BY MOUTH THREE TIMES A DAY BEFORE MEALS AS NEEDED FOR IBS SYMPTOMS, Disp: 30 capsule, Rfl: 0 .  Galcanezumab-gnlm (EMGALITY) 120 MG/ML SOAJ, Inject 120 mg into the skin every 30 (thirty) days., Disp: 1 pen, Rfl: 11 .  levocetirizine (XYZAL) 5 MG tablet, Take 5 mg by mouth every evening., Disp: , Rfl:  .  LO LOESTRIN FE 1 MG-10 MCG / 10 MCG tablet, , Disp: , Rfl:  .  meloxicam (MOBIC) 15 MG tablet, Take 1 tablet (15 mg total) by mouth daily., Disp: 30 tablet, Rfl: 3 .   omeprazole (PRILOSEC) 20 MG capsule, Take 1 capsule (20 mg total) by mouth daily., Disp: 90 capsule, Rfl: 0 .  zolmitriptan (ZOMIG-ZMT) 5 MG disintegrating tablet, Take 1 tablet earliest onset of migraine.  May repeat in 2 hours if needed. Maximum 2 tablets in 24 hours, Disp: 9 tablet, Rfl: 3  Social History   Tobacco Use  Smoking Status Never Smoker  Smokeless Tobacco Never Used    Allergies  Allergen Reactions  . Augmentin [Amoxicillin-Pot Clavulanate]     Upset stomach  . Erythromycin Other (See Comments)    Upset stomach  . Omnicef [Cefdinir]     Upset stomach   . Zithromax [Azithromycin]    Objective:  There were no vitals filed for this visit. There is no height or weight on file to calculate BMI. Constitutional Well developed. Well nourished.  Vascular Dorsalis pedis pulses palpable bilaterally. Posterior tibial pulses palpable bilaterally. Capillary refill normal to all digits.  No cyanosis or clubbing noted. Pedal hair growth normal.  Neurologic Normal speech. Oriented to person, place, and time. Epicritic sensation to light touch grossly present bilaterally.  Dermatologic Nails well groomed and normal in appearance. No open wounds. No skin lesions.  Orthopedic: Normal joint ROM without pain or crepitus bilaterally. No visible deformities.  tender to palpation at the calcaneal tuber right. No pain with calcaneal squeeze right. Ankle ROM diminished range of motion right. Silfverskiold Test: negative right.   Radiographs: None  Assessment:  No diagnosis found. Plan:  Patient was evaluated and treated and all questions answered.  Plantar Fasciitis, right -No new x-rays taken - Educated on icing and stretching. Instructions given.  - Injection delivered to the plantar fascia as below.  This will be her third injection - DME: None - Pharmacologic management: None. Educated on risks/benefits and proper taking of medication. -Due to financial restriction I  will hold off on giving the patient boot at this time.  However if her pain has not improved patient will call the office and we will plan on dispensing the boot without seeing me.  I have asked the patient to place her self in the boot.   -Patient will also be going out of town for a couple of weeks to take care of her father as he may be undergoing cancer treatment.  Left hallux onychomycosis -Educated the patient on the etiology of onychomycosis and various treatment options associated with improving the fungal load.  I explained to the patient that there is 3 treatment options available to treat the onychomycosis including topical, p.o., laser treatment.  Patient will hold off on the treatment for now.   Flexible pes planus -Patient received the orthotics.  Patient managing the orthotics well.  No acute complaints  Procedure: Injection Tendon/Ligament Location: Right plantar fascia at the glabrous junction; medial approach. Skin Prep: alcohol Injectate: 0.5 cc 0.5% marcaine plain, 0.5 cc of 1% Lidocaine, 0.5 cc kenalog 10. Disposition: Patient tolerated procedure well. Injection site dressed with a band-aid.  No follow-ups on file.

## 2019-03-21 NOTE — Progress Notes (Signed)
Due to the COVID-19 crisis, this virtual check-in visit was done via telephone from my office and it was initiated and consent given by this patient and or family.   Telephone (Audio) Visit The purpose of this telephone visit is to provide medical care while limiting exposure to the novel coronavirus.    Consent was obtained for telephone visit and initiated by pt/family:  Yes.   Answered questions that patient had about telehealth interaction:  Yes.   I discussed the limitations, risks, security and privacy concerns of performing an evaluation and management service by telephone. I also discussed with the patient that there may be a patient responsible charge related to this service. The patient expressed understanding and agreed to proceed.  Pt location: Home Physician Location: office Name of referring provider:  Pleas Koch, NP I connected with .Severiano Gilbert at patients initiation/request on 03/22/2019 at  3:30 PM EST by telephone and verified that I am speaking with the correct person using two identifiers.  Pt MRN:  [id number] Pt DOB:  [birth year]   History of Present Illness:  Maria Mejia is a 49 year old female who follows up for migraines and family history of cerebral aneurysm.    UPDATE: Intensity:  Moderate to severe Duration:  No more than an hour with Zomig Frequency:  3 to 4 a month until increased frequency of more mild headaches over past 2 weeks (reports increased stress). Frequency of abortive medication: 3 or 4 a month Rescue protocol:  Zomig for migraine; Zyrtec or Cold and Sinus medication for sinus headache Current NSAIDS:  ibuprofen Current analgesics:  none Current triptans:  Zomig-ZMT 5mg  Current ergotamine:  none Current anti-emetic:  none Current muscle relaxants:  none Current anti-anxiolytic:  none Current sleep aide:  none Current Antihypertensive medications:  none Current Antidepressant medications:  Wellbutrin XL Current Anticonvulsant  medications:  none Current anti-CGRP:  Emgality Current Vitamins/Herbal/Supplements:  none Current Antihistamines/Decongestants:  Zyrtec Other therapy:  none Hormone/birth control:  Lo Loestrin Fe  Caffeine:  Rarely drinks coffee.  Drinks 1 energy drink a day. Diet:  At least 1/2 gallon water daily. Exercise:  yes Depression:  mild; Anxiety:  no Other pain:  no Sleep hygiene:  Interrupted.  HISTORY: Onset:  Adolescence.  A year and a half ago, she started cosmetic Botox around the eyes that was effective for her migraines and was able to get off of topiramate.  She hasn't received cosmetic botox since prior to Covid-19 pandemic started. Location:  Right retro-orbital Quality:  pressure Initial intensity:   Moderate to severe.  She denies new headache, thunderclap headache Aura:  She once had an ocular migraine presenting as visual aura with flashing lights lasting 30 minutes.  No associated headache. Premonitory Phase:  no Postdrome:  Migraine hangover for 2 hours Associated symptoms:  Photophobia, phonophobia.  She rarely has nausea and vomiting.  She denies associated osmophobia or unilateral numbness or weakness. Initial duration:  If treated quickly, it lasts about an hour Initial frequency:  At least 9 migraines a month.  She reports more mild "sinus headaches" as well (total 15 headache days a month).  Initial frequency of abortive medication: varies Triggers:  Change in barometric pressure, artificial sweetener, peanut oil, caffeine withdrawal, menstrual cycle Relieving factors:  Caffeine only if with caffeine withdrawal  Activity:  Aggravates but able to function  She has family history of cerebral aneurysm.  Mother passed away from aneurysm rupture in 2001.  Her mom's brother and sister have  aneurysms as well.    08/20/2015 MRI Brain with and without:  2 punctate nonenhancing nonspecific hyperintense foci in the right frontal white matter 09/18/2015 CTA Head:  No aneurysm  or other vascular abnormality.   Past NSAIDS:  Ibuprofen, naproxen Past analgesics:  Tylenol, Excedrin Past abortive triptans:  Sumatriptan, rizatriptan Past abortive ergotamine:  none Past muscle relaxants:  none Past anti-emetic:  Zofran ODT 4mg  Past antihypertensive medications:  none Past antidepressant medications:  none Past anticonvulsant medications:  topiramate 100mg  twice daily Past anti-CGRP:  none Past vitamins/Herbal/Supplements:  none Other past therapies:  none   Family history of headache:  Mom (migraines); maternal uncle (migraines as young adult)   Observations/Objective:   There were no vitals filed for this visit.  Assessment and Plan:   Migraine without aura, without status migrainosus, not intractable.  Overall improved.  Increased mild headaches over past couple of weeks due to stress.  1.  For preventative management, Emgality 2.  For abortive therapy, Zomig-ZMT 5mg  3.  Limit use of pain relievers to no more than 2 days out of week to prevent risk of rebound or medication-overuse headache. 4.  Keep headache diary 5.  Exercise, hydration, caffeine cessation, sleep hygiene, monitor for and avoid triggers 6.  Follow up 4 months   Need for in person visit now:  No Follow Up Instructions:    -I discussed the assessment and treatment plan with the patient. The patient was provided an opportunity to ask questions and all were answered. The patient agreed with the plan and demonstrated an understanding of the instructions.   The patient was advised to call back or seek an in-person evaluation if the symptoms worsen or if the condition fails to improve as anticipated.    Total Time spent in visit with the patient was:  8 minutes   Dudley Major, DO

## 2019-03-22 ENCOUNTER — Other Ambulatory Visit: Payer: Self-pay

## 2019-03-22 ENCOUNTER — Encounter: Payer: Self-pay | Admitting: Neurology

## 2019-03-22 ENCOUNTER — Telehealth (INDEPENDENT_AMBULATORY_CARE_PROVIDER_SITE_OTHER): Payer: 59 | Admitting: Neurology

## 2019-03-22 DIAGNOSIS — G43009 Migraine without aura, not intractable, without status migrainosus: Secondary | ICD-10-CM

## 2019-04-04 ENCOUNTER — Encounter: Payer: 59 | Admitting: Podiatry

## 2019-04-10 ENCOUNTER — Telehealth: Payer: Self-pay | Admitting: Podiatry

## 2019-04-10 NOTE — Telephone Encounter (Signed)
Pt left message with questions about the orthotics she received about 6 wks ago.   I returned call and she said she was told she would not need a stability shoe but she has several shoes including new balance that are not stability shoes and when she wears the orthotics for a good walk/hike her feet start cramping.  I told pt I was working on Rick's schedule and would call her when I know when he is going to be in [place] office I would call her to get her scheduled.

## 2019-04-17 ENCOUNTER — Encounter: Payer: 59 | Admitting: Orthotics

## 2019-04-17 ENCOUNTER — Other Ambulatory Visit: Payer: Self-pay

## 2019-05-06 ENCOUNTER — Encounter: Payer: Self-pay | Admitting: *Deleted

## 2019-05-06 ENCOUNTER — Telehealth: Payer: Self-pay | Admitting: Neurology

## 2019-05-06 NOTE — Telephone Encounter (Signed)
Replied to this call via My Chart there was a message there as well. My reponse: We recieved the PA request on 05/03/2019 we were closed for Good Friday. The Prior authorization will be sent electronically (the fastest route) today. Your pharmacy will be notified at the same time we are notified regarding the determination of the request. Hopefully this will be approved today.

## 2019-05-06 NOTE — Progress Notes (Addendum)
Severiano Gilbert Key: [id number] - PA Case ID: [id number] - Rx #[id number] Need help? Call us at [phone] Outcome Approvedtoday Your PA request has been approved. Additional information will be provided in the approval communication. (Message 1145) Drug Emgality 120MG /ML auto-injectors (migraine) Form [employer] Electronic PA Form (2017 NCPDP) Original Claim Info 30  Received faxed approval valid 05/06/2019-05/05/2020 Sent fax to scan into her chart

## 2019-05-06 NOTE — Telephone Encounter (Signed)
Patient called and left a message over the holiday weekend. She said she wants to check on the status of the prior authorization for her Emgality.

## 2019-05-10 DIAGNOSIS — R197 Diarrhea, unspecified: Secondary | ICD-10-CM

## 2019-05-10 MED ORDER — DICYCLOMINE HCL 10 MG PO CAPS: ORAL_CAPSULE | ORAL | 0 refills | Status: AC

## 2019-05-10 NOTE — Telephone Encounter (Signed)
dicyclomine (BENTYL) 10 MG  Last prescribed on 08/16/2017 Last appointment on 11/19/2018. No future appointment

## 2019-06-19 MED ORDER — LO LOESTRIN FE 1 MG-10 MCG / 10 MCG PO TABS: 1.0000 | ORAL_TABLET | Freq: Every day | ORAL | 0 refills | Status: DC

## 2019-06-19 NOTE — Telephone Encounter (Signed)
Found it. Sent as instructed.

## 2019-06-19 NOTE — Telephone Encounter (Signed)
Maria Mejia, we have not refill her birth control pills. Are you okay with this request? Last OV was 11/19/2018 and no future appointment have been scheduled.

## 2019-06-19 NOTE — Telephone Encounter (Signed)
Maria Mejia, I can't find this pharmacy. Can you help? I just want to send one pack of her birth control.

## 2019-07-29 ENCOUNTER — Telehealth: Payer: Self-pay | Admitting: Neurology

## 2019-07-29 NOTE — Telephone Encounter (Signed)
No answer

## 2019-07-29 NOTE — Telephone Encounter (Signed)
Pt returned call, inst her that, per Dr Tomi Likens, neither of the side effects she is reporting would be caused by the use of Emgality, she verbalized understanding, no further questions.

## 2019-07-29 NOTE — Telephone Encounter (Signed)
Patient called and would like to speak to someone about her medication Emgality   She states that she is having some high blood pressure and weight gain over the last couple of months and wants to know if this could be from the medication

## 2019-08-01 ENCOUNTER — Ambulatory Visit: Payer: 59 | Admitting: Neurology

## 2019-08-01 ENCOUNTER — Other Ambulatory Visit: Payer: Self-pay

## 2019-08-01 ENCOUNTER — Ambulatory Visit (INDEPENDENT_AMBULATORY_CARE_PROVIDER_SITE_OTHER): Payer: 59 | Admitting: Primary Care

## 2019-08-01 ENCOUNTER — Encounter: Payer: Self-pay | Admitting: Primary Care

## 2019-08-01 VITALS — BP 140/94 | HR 90 | Temp 96.1°F | Ht 63.0 in | Wt 189.5 lb

## 2019-08-01 DIAGNOSIS — R030 Elevated blood-pressure reading, without diagnosis of hypertension: Secondary | ICD-10-CM

## 2019-08-01 DIAGNOSIS — R079 Chest pain, unspecified: Secondary | ICD-10-CM

## 2019-08-01 LAB — BASIC METABOLIC PANEL
BUN: 22 mg/dL (ref 6–23)
CO2: 27 mEq/L (ref 19–32)
Calcium: 9.5 mg/dL (ref 8.4–10.5)
Chloride: 105 mEq/L (ref 96–112)
Creatinine, Ser: 1.07 mg/dL (ref 0.40–1.20)
GFR: 54.6 mL/min — ABNORMAL LOW (ref >60.00)
Glucose, Bld: 101 mg/dL — ABNORMAL HIGH (ref 70–99)
Potassium: 4.8 mEq/L (ref 3.5–5.1)
Sodium: 139 mEq/L (ref 135–145)

## 2019-08-01 LAB — LIPID PANEL
Cholesterol: 171 mg/dL (ref 0–200)
HDL: 46.8 mg/dL (ref >39.00)
LDL Cholesterol: 99 mg/dL (ref 0–99)
NonHDL: 124.07
Total CHOL/HDL Ratio: 4
Triglycerides: 126 mg/dL (ref 0.0–149.0)
VLDL: 25.2 mg/dL (ref 0.0–40.0)

## 2019-08-01 LAB — CBC
HCT: 41.7 % (ref 36.0–46.0)
Hemoglobin: 14.4 g/dL (ref 12.0–15.0)
MCHC: 34.6 g/dL (ref 30.0–36.0)
MCV: 94 fl (ref 78.0–100.0)
Platelets: 224 10*3/uL (ref 150.0–400.0)
RBC: 4.44 Mil/uL (ref 3.87–5.11)
RDW: 12.8 % (ref 11.5–15.5)
WBC: 6.9 10*3/uL (ref 4.0–10.5)

## 2019-08-01 NOTE — Patient Instructions (Signed)
Stop by the lab prior to leaving today.   We will discuss treatment once I receive your results.   It was a pleasure to see you today!

## 2019-08-01 NOTE — Assessment & Plan Note (Addendum)
Suspect this is more muscular in nature, but need to rule out other causes, especially given elevated BP and FH of heart disease.  TSH unremarkable from GYN office, reviewed results on patients phone. Will obtain records.   Checking D-dimer, BMP, CBC.   ECG today with NSR with rate of 74. No ST changes, PAC/PVC. No old ECG to compare.

## 2019-08-01 NOTE — Assessment & Plan Note (Signed)
No prior diagnosis of hypertension, has elevated readings at home and also at [facility] office.  Unclear etiology, but given FH of heart disease this could very well be a new diagnosis of essential hypertension. She is under a great deal of stress, has gained 10 pounds since last visit.  Discussed to work on diet, continue exercise. Checking labs today. Consider HCTZ if renal function is unremarkable.  Await labs.

## 2019-08-01 NOTE — Progress Notes (Signed)
Subjective:    Patient ID: Maria Mejia, female    DOB: Jul 31, 1970, 49 y.o.   MRN: [id number]  HPI  This visit occurred during the SARS-CoV-2 public health emergency.  Safety protocols were in place, including screening questions prior to the visit, additional usage of staff PPE, and extensive cleaning of exam room while observing appropriate contact time as indicated for disinfecting solutions.   Maria Mejia is a 49 year old female with a history of migraines, anxiety and depression, hyperlipidemia who presents today with a chief complaint of elevated blood pressure.   She's noticed elevated blood pressure readings for the last month, has been checking BP on her father's cuff. She also had a high reading at her GYN's office earlier this week so her GYN discontinued her birth control and completed lab work, labs were "normal" including testosterone, A1C, vitamin B 12 and D, TSH. Blood pressure has been running 140's-160's/80's-90's with her home cuff.  She's been under a lot of stress as she has been helping to care for her father with Alzheimer's. Also with a lot of work stress for the last six months, she doesn't anticipate work stress to reduce.  She doesn't believe that her diet has been unhealthy, denies increase in salt intake. She exercises 4-5 days weekly at the gym, did bench presses four days ago, didn't feel like she overdid it. Since then she's noticed left sided chest pressure that feels like a "tugging", also pain with deep inspiration. She cannot reproduce the pain with movement.   Family history of CAD and hypertension in her father. She is a non smoker. She has a mammogram recently which was normal.   BP Readings from Last 3 Encounters:  08/01/19 (!) 140/94  11/19/18 124/84  06/29/18 122/84   Wt Readings from Last 3 Encounters:  08/01/19 189 lb 8 oz (86 kg)  11/19/18 184 lb (83.5 kg)  11/15/18 179 lb (81.2 kg)     Review of Systems  Eyes: Negative for visual disturbance.   Respiratory: Negative for shortness of breath.        Pain with deep inspiration   Neurological: Negative for dizziness and headaches.       Past Medical History:  Diagnosis Date  . Headache   . Migraines      Social History   Socioeconomic History  . Marital status: Divorced    Spouse name: Not on file  . Number of children: 0  . Years of education: Not on file  . Highest education level: Not on file  Occupational History  . Not on file  Tobacco Use  . Smoking status: Never Smoker  . Smokeless tobacco: Never Used  Vaping Use  . Vaping Use: Never used  Substance and Sexual Activity  . Alcohol use: Yes    Alcohol/week: 1.0 standard drink    Types: 1 Shots of liquor per week    Comment: occcasioally  . Drug use: No  . Sexual activity: Yes    Partners: Male    Birth control/protection: Pill  Other Topics Concern  . Not on file  Social History Narrative   Right handed       HS graduate      Two story home         Social Determinants of Health   Financial Resource Strain:   . Difficulty of Paying Living Expenses:   Food Insecurity:   . Worried About Charity fundraiser in the Last Year:   .  Ran Out of Food in the Last Year:   Transportation Needs:   . Film/video editor (Medical):   Marland Kitchen Lack of Transportation (Non-Medical):   Physical Activity:   . Days of Exercise per Week:   . Minutes of Exercise per Session:   Stress:   . Feeling of Stress :   Social Connections:   . Frequency of Communication with Friends and Family:   . Frequency of Social Gatherings with Friends and Family:   . Attends Religious Services:   . Active Member of Clubs or Organizations:   . Attends Archivist Meetings:   Marland Kitchen Marital Status:   Intimate Partner Violence:   . Fear of Current or Ex-Partner:   . Emotionally Abused:   Marland Kitchen Physically Abused:   . Sexually Abused:     Past Surgical History:  Procedure Laterality Date  . ARTHOSCOPIC ROTAOR CUFF REPAIR Left     . BREAST REDUCTION SURGERY  2009  . NASAL SEPTUM SURGERY      Family History  Problem Relation Age of Onset  . Migraines Mother   . Stroke Mother   . Migraines Paternal Uncle   . Alzheimer's disease Maternal Grandfather   . Alzheimer's disease Paternal Grandfather   . Heart attack Father   . Hypertension Father   . Renal cancer Father   . Alzheimer's disease Father     Allergies  Allergen Reactions  . Augmentin [Amoxicillin-Pot Clavulanate]     Upset stomach  . Erythromycin Other (See Comments)    Upset stomach  . Omnicef [Cefdinir]     Upset stomach   . Zithromax [Azithromycin]     Current Outpatient Medications on File Prior to Visit  Medication Sig Dispense Refill  . buPROPion (WELLBUTRIN XL) 150 MG 24 hr tablet Take 1 tablet (150 mg total) by mouth daily. 90 tablet 3  . dicyclomine (BENTYL) 10 MG capsule TAKE ONE CAPSULE BY MOUTH THREE TIMES A DAY BEFORE MEALS AS NEEDED FOR IBS SYMPTOMS 30 capsule 0  . Galcanezumab-gnlm (EMGALITY) 120 MG/ML SOAJ Inject 120 mg into the skin every 30 (thirty) days. 1 pen 11  . levocetirizine (XYZAL) 5 MG tablet Take 5 mg by mouth every evening.    Marland Kitchen omeprazole (PRILOSEC) 20 MG capsule Take 1 capsule (20 mg total) by mouth daily. 90 capsule 0  . zolmitriptan (ZOMIG-ZMT) 5 MG disintegrating tablet Take 1 tablet earliest onset of migraine.  May repeat in 2 hours if needed. Maximum 2 tablets in 24 hours 9 tablet 3   No current facility-administered medications on file prior to visit.    BP (!) 140/94   Pulse 90   Temp (!) 96.1 F (35.6 C) (Temporal)   Ht 5\' 3"  (1.6 m)   Wt 189 lb 8 oz (86 kg)   SpO2 98%   BMI 33.57 kg/m    Objective:   Physical Exam Cardiovascular:     Rate and Rhythm: Normal rate and regular rhythm.  Pulmonary:     Effort: Pulmonary effort is normal.     Breath sounds: Normal breath sounds.  Musculoskeletal:     Cervical back: Neck supple.  Skin:    General: Skin is warm and dry.             Assessment & Plan:

## 2019-08-02 ENCOUNTER — Ambulatory Visit
Admission: RE | Admit: 2019-08-02 | Discharge: 2019-08-02 | Disposition: A | Discharge status: Home / Self Care | Payer: 59 | Source: Ambulatory Visit | Attending: Primary Care | Admitting: Primary Care

## 2019-08-02 ENCOUNTER — Telehealth: Payer: Self-pay | Admitting: Internal Medicine

## 2019-08-02 ENCOUNTER — Telehealth: Payer: Self-pay

## 2019-08-02 ENCOUNTER — Other Ambulatory Visit: Payer: Self-pay | Admitting: Primary Care

## 2019-08-02 ENCOUNTER — Encounter: Payer: Self-pay | Admitting: Primary Care

## 2019-08-02 DIAGNOSIS — R071 Chest pain on breathing: Secondary | ICD-10-CM

## 2019-08-02 DIAGNOSIS — R030 Elevated blood-pressure reading, without diagnosis of hypertension: Secondary | ICD-10-CM

## 2019-08-02 HISTORY — DX: Essential (primary) hypertension: I10

## 2019-08-02 HISTORY — DX: Unspecified malignant neoplasm of skin, unspecified: C44.90

## 2019-08-02 LAB — D-DIMER, QUANTITATIVE: D-Dimer, Quant: 0.72 mcg/mL FEU — ABNORMAL HIGH (ref <0.50)

## 2019-08-02 MED ORDER — IOHEXOL 350 MG/ML SOLN
75.0000 mL | Freq: Once | INTRAVENOUS | Status: AC | PRN
  Administered 2019-08-02: 75 mL via INTRAVENOUS

## 2019-08-02 MED ORDER — LOSARTAN POTASSIUM 25 MG PO TABS: 25.0000 mg | ORAL_TABLET | Freq: Every day | ORAL | 0 refills | Status: DC

## 2019-08-02 NOTE — Telephone Encounter (Signed)
[facility] Night - Client TELEPHONE ADVICE RECORD [facility] Patient Name: Maria Mejia Gender: Female DOB: 30-Mar-1970 Age: 49 Y 65 M Return Phone Number: [phone] (Primary) Address: City/State/Zip: [place] [address] Client [facility] Night - Client Client Site [place] Physician Alma Friendly - NP Contact Type Call Who Is Calling Lab Lab Name [facility] Phone Number [phone] Lab Tech Name Welford Roche Reference Number [id number] P Chief Complaint Lab Result (Critical or Stat) Call Type Lab Send to RN Reason for Call Report lab results Initial Comment Caller is calling in critical lab values. Additional Comment Translation No Nurse Assessment Nurse: Sherrell Puller, RN, Amy Date/Time Eilene Ghazi Time): 08/02/2019 7:02:48 AM Is there an on-call provider listed? ---Yes Please list name of person reporting value (Lab Employee) and a contact number. ---Bobetta Lime with [facility] [phone] Please document the following items: Lab name Lab value (read back to lab to verify) Reference range for lab value Date and time blood was drawn Collect time of birth for bilirubin results ---D-Dimer 0.72 Collected on 08/01/19 at 0820 Please collect the patient contact information from the lab. (name, phone number and address) ---Severiano Gilbert [phone] Guidelines Guideline Title Affirmed Question Affirmed Notes Nurse Date/Time Eilene Ghazi Time) Disp. Time Eilene Ghazi Time) Disposition Final User 08/02/2019 7:10:33 AM Called On-Call Provider Sherrell Puller, RN, Amy 08/02/2019 7:11:08 AM Clinical Call Yes Sherrell Puller, RN, Amy Paging DoctorName Phone DateTime Result/Outcome Message Type Notes Einar Pheasant - MD [phone] 08/02/2019 7:10:33 AM Called On Call Provider - Reached Doctor Paged PLEASE NOTE: All timestamps contained within this report are represented as [place] Standard Time. CONFIDENTIALTY NOTICE: This fax transmission is intended only  for the addressee. It contains information that is legally privileged, confidential or otherwise protected from use or disclosure. If you are not the intended recipient, you are strictly prohibited from reviewing, disclosing, copying using or disseminating any of this information or taking any action in reliance on or regarding this information. If you have received this fax in error, please notify us immediately by telephone so that we can arrange for its return to [place]. Phone: [phone], Toll-Free: [phone], Fax: [phone] Page: 2 of 2 Call Id: [id number] Paging DoctorName Phone DateTime Result/Outcome Message Type Notes Einar Pheasant - MD 08/02/2019 7:10:59 AM Spoke with On Call - General Message Result Provided Dr. Nicki Reaper with patient's critical lab value. No further orders given.

## 2019-08-02 NOTE — Telephone Encounter (Signed)
Spoken to patient. Please see result note in Lab results

## 2019-08-02 NOTE — Telephone Encounter (Signed)
Just received CT call report from [place] with [facility]:  1.  Neg for PE 2.  Mild atelectasis  3.  Otherwise negative CT  Patient did leave after imaging obtained.   FYI to Allie Bossier, NP

## 2019-08-02 NOTE — Telephone Encounter (Signed)
Received call this am with lab result.  D-dimer .72.  Called pt.  Evaluated yesterday for elevated blood pressure and some left chest discomfort.  Has been working out.  No increased sob or change in symptoms since evaluation.  States has noticed more sob with stairs over the last month, but has attributed this to gaining weight.  Discussed with her regarding further w/up.  Explained that I would forward information to you and that further testing would be determined after your review.  Pt comfortable with this plan.

## 2019-08-02 NOTE — Telephone Encounter (Signed)
Per this phone note Gentry Fitz NP has already spoken with Dr Nicki Reaper. FYI to [place] [facility].

## 2019-08-02 NOTE — Telephone Encounter (Signed)
This was already reviewed by myself personally about 1 hour ago.  Patient is aware of results.

## 2019-08-02 NOTE — Telephone Encounter (Signed)
Spoke with Dr. Nicki Reaper this morning, result also reviewed. Patient to be notified as stat CT angio chest is ordered and pending for today.

## 2019-08-12 ENCOUNTER — Ambulatory Visit: Payer: 59 | Admitting: Primary Care

## 2019-08-13 ENCOUNTER — Encounter: Payer: Self-pay | Admitting: Primary Care

## 2019-08-13 ENCOUNTER — Ambulatory Visit (INDEPENDENT_AMBULATORY_CARE_PROVIDER_SITE_OTHER): Payer: 59 | Admitting: Primary Care

## 2019-08-13 ENCOUNTER — Other Ambulatory Visit: Payer: Self-pay

## 2019-08-13 DIAGNOSIS — R030 Elevated blood-pressure reading, without diagnosis of hypertension: Secondary | ICD-10-CM | POA: Diagnosis not present

## 2019-08-13 DIAGNOSIS — F419 Anxiety disorder, unspecified: Secondary | ICD-10-CM

## 2019-08-13 DIAGNOSIS — G43009 Migraine without aura, not intractable, without status migrainosus: Secondary | ICD-10-CM | POA: Diagnosis not present

## 2019-08-13 DIAGNOSIS — F329 Major depressive disorder, single episode, unspecified: Secondary | ICD-10-CM | POA: Diagnosis not present

## 2019-08-13 LAB — BASIC METABOLIC PANEL
BUN: 18 mg/dL (ref 6–23)
CO2: 30 mEq/L (ref 19–32)
Calcium: 9.6 mg/dL (ref 8.4–10.5)
Chloride: 101 mEq/L (ref 96–112)
Creatinine, Ser: 1.01 mg/dL (ref 0.40–1.20)
GFR: 58.35 mL/min — ABNORMAL LOW (ref >60.00)
Glucose, Bld: 89 mg/dL (ref 70–99)
Potassium: 4.6 mEq/L (ref 3.5–5.1)
Sodium: 137 mEq/L (ref 135–145)

## 2019-08-13 MED ORDER — LOSARTAN POTASSIUM 25 MG PO TABS: 25.0000 mg | ORAL_TABLET | Freq: Every day | ORAL | 3 refills | Status: DC

## 2019-08-13 NOTE — Assessment & Plan Note (Signed)
Improved with losartan 25 mg, borderline high today, but home readings are better.  Continue losartan 25 mg. She will monitor BP readings at home. Repeat BMP pending. Refills sent to pharmacy.

## 2019-08-13 NOTE — Assessment & Plan Note (Signed)
Following with neurology, doing well on current regimen.

## 2019-08-13 NOTE — Progress Notes (Signed)
Subjective:    Patient ID: Maria Mejia, female    DOB: 03-20-1970, 49 y.o.   MRN: [id number]  HPI  This visit occurred during the SARS-CoV-2 public health emergency.  Safety protocols were in place, including screening questions prior to the visit, additional usage of staff PPE, and extensive cleaning of exam room while observing appropriate contact time as indicated for disinfecting solutions.   Ms. Maria Mejia is a 49 year old female with a history of migraines, elevated blood pressure reading, anxiety and depression who presents today for follow up of blood pressure.  She was last evaluated two weeks ago for a one month history of elevated blood pressure readings, checking on her father's BP cuff. BP was above goal during that visit as well. 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 Given elevated readings, coupled with stress, we initiated losartan 25 mg and asked her to follow up today.   She's checking her BP at home which is ranging 120's-130's/80's. She denies headaches, dizziness, visual changes. She is compliant to losartan 25 mg daily. She continues to be frustrated with her weight, this is adding to her stress level.   Wt Readings from Last 3 Encounters:  08/13/19 189 lb (85.7 kg)  08/01/19 189 lb 8 oz (86 kg)  11/19/18 184 lb (83.5 kg)     BP Readings from Last 3 Encounters:  08/13/19 134/82  08/01/19 (!) 140/94  11/19/18 124/84     Review of Systems  Eyes: Negative for visual disturbance.  Respiratory: Negative for shortness of breath.   Cardiovascular: Negative for chest pain.  Neurological: Negative for dizziness and headaches.       Past Medical History:  Diagnosis Date   Headache    Hypertension    Migraines    Skin cancer      Social History   Socioeconomic History   Marital status: Divorced    Spouse name: Not on file   Number of children: 0   Years of education: Not on file   Highest education  level: Not on file  Occupational History   Not on file  Tobacco Use   Smoking status: Never Smoker   Smokeless tobacco: Never Used  Vaping Use   Vaping Use: Never used  Substance and Sexual Activity   Alcohol use: Yes    Alcohol/week: 1.0 standard drink    Types: 1 Shots of liquor per week    Comment: occcasioally   Drug use: No   Sexual activity: Yes    Partners: Male    Birth control/protection: Pill  Other Topics Concern   Not on file  Social History Narrative   Right handed       HS graduate      Two story home         Social Determinants of Health   Financial Resource Strain:    Difficulty of Paying Living Expenses:   Food Insecurity:    Worried About Charity fundraiser in the Last Year:    Arboriculturist in the Last Year:   Transportation Needs:    Film/video editor (Medical):    Lack of Transportation (Non-Medical):   Physical Activity:    Days of Exercise per Week:    Minutes of Exercise per Session:   Stress:    Feeling of Stress :   Social Connections:    Frequency of Communication with Friends and Family:    Frequency of  Social Gatherings with Friends and Family:    Attends Religious Services:    Active Member of Clubs or Organizations:    Attends Music therapist:    Marital Status:   Intimate Production manager Violence:    Fear of Current or Ex-Partner:    Emotionally Abused:    Physically Abused:    Sexually Abused:     Past Surgical History:  Procedure Laterality Date   ARTHOSCOPIC ROTAOR CUFF REPAIR Left    BREAST REDUCTION SURGERY  2009   NASAL SEPTUM SURGERY      Family History  Problem Relation Age of Onset   Migraines Mother    Stroke Mother    Migraines Paternal Uncle    Alzheimer's disease Maternal Grandfather    Alzheimer's disease Paternal Grandfather    Heart attack Father    Hypertension Father    Renal cancer Father    Alzheimer's disease Father     Allergies    Allergen Reactions   Augmentin [Amoxicillin-Pot Clavulanate]     Upset stomach   Erythromycin Other (See Comments)    Upset stomach   Omnicef [Cefdinir]     Upset stomach    Zithromax [Azithromycin]     Current Outpatient Medications on File Prior to Visit  Medication Sig Dispense Refill   buPROPion (WELLBUTRIN XL) 150 MG 24 hr tablet Take 1 tablet (150 mg total) by mouth daily. 90 tablet 3   dicyclomine (BENTYL) 10 MG capsule TAKE ONE CAPSULE BY MOUTH THREE TIMES A DAY BEFORE MEALS AS NEEDED FOR IBS SYMPTOMS 30 capsule 0   Galcanezumab-gnlm (EMGALITY) 120 MG/ML SOAJ Inject 120 mg into the skin every 30 (thirty) days. 1 pen 11   levocetirizine (XYZAL) 5 MG tablet Take 5 mg by mouth every evening.     omeprazole (PRILOSEC) 20 MG capsule Take 1 capsule (20 mg total) by mouth daily. 90 capsule 0   zolmitriptan (ZOMIG-ZMT) 5 MG disintegrating tablet Take 1 tablet earliest onset of migraine.  May repeat in 2 hours if needed. Maximum 2 tablets in 24 hours 9 tablet 3   No current facility-administered medications on file prior to visit.    BP 134/82    Pulse 86    Temp (!) 96.5 F (35.8 C) (Temporal)    Ht 5\' 3"  (1.6 m)    Wt 189 lb (85.7 kg)    SpO2 98%    BMI 33.48 kg/m    Objective:   Physical Exam Cardiovascular:     Rate and Rhythm: Normal rate and regular rhythm.  Pulmonary:     Effort: Pulmonary effort is normal.     Breath sounds: Normal breath sounds.  Musculoskeletal:     Cervical back: Neck supple.  Skin:    General: Skin is warm and dry.            Assessment & Plan:

## 2019-08-13 NOTE — Assessment & Plan Note (Signed)
Overall feels that Wellbutrin is helping, she is under a lot of stress with her personal and work life. She would like to continue Wellbutrin at the current dose, could not tolerate dose increase.  Continue to monitor.  Consider adding SSRI. She will update.

## 2019-08-13 NOTE — Patient Instructions (Signed)
Stop by the lab prior to leaving today. I will notify you of your results once received.   Continue taking losartan 25 mg for blood pressure.  [facility] in [place].  It was a pleasure to see you today!

## 2019-08-28 ENCOUNTER — Other Ambulatory Visit: Payer: Self-pay | Admitting: Primary Care

## 2019-08-28 DIAGNOSIS — R030 Elevated blood-pressure reading, without diagnosis of hypertension: Secondary | ICD-10-CM

## 2019-09-04 ENCOUNTER — Encounter: Payer: 59 | Admitting: Dermatology

## 2019-09-17 DIAGNOSIS — J3489 Other specified disorders of nose and nasal sinuses: Secondary | ICD-10-CM

## 2019-09-17 DIAGNOSIS — R059 Cough, unspecified: Secondary | ICD-10-CM

## 2019-09-18 NOTE — Telephone Encounter (Signed)
Patient l eft a voicemail stating that she sent a mychart message last night regarding sinus issues. Patient stated that she is having problems trying to get onto mychart today and not sure if you have responded to her.

## 2019-09-19 NOTE — Telephone Encounter (Signed)
Pt left v/m that she has not gotten cb yet about results; pt cannot get into my chart. Pt request cb.

## 2019-09-20 ENCOUNTER — Ambulatory Visit
Admission: EM | Admit: 2019-09-20 | Discharge: 2019-09-20 | Disposition: A | Discharge status: Home / Self Care | Payer: 59 | Attending: Emergency Medicine | Admitting: Emergency Medicine

## 2019-09-20 DIAGNOSIS — R05 Cough: Secondary | ICD-10-CM | POA: Diagnosis not present

## 2019-09-20 DIAGNOSIS — J069 Acute upper respiratory infection, unspecified: Secondary | ICD-10-CM | POA: Diagnosis present

## 2019-09-20 DIAGNOSIS — J020 Streptococcal pharyngitis: Secondary | ICD-10-CM | POA: Diagnosis present

## 2019-09-20 DIAGNOSIS — R059 Cough, unspecified: Secondary | ICD-10-CM

## 2019-09-20 LAB — POCT RAPID STREP A (OFFICE): Rapid Strep A Screen: NEGATIVE

## 2019-09-20 NOTE — Telephone Encounter (Signed)
Pt called back stating her ears and throat are hurting worse  Cough  Nothing comes up when she cough no fever no sob Please advise what pt needs to do   Do not sent anything through my chart Best number [phone]

## 2019-09-20 NOTE — Discharge Instructions (Signed)
Your rapid strep test is negative.  A throat culture is pending; we will call you if it is positive requiring treatment.    Your COVID test is pending.  You should self quarantine until the test result is back.    Take Tylenol as needed for fever or discomfort.  Rest and keep yourself hydrated.    Go to the emergency department if you develop acute worsening symptoms.     

## 2019-09-20 NOTE — ED Provider Notes (Signed)
Roderic Palau    CSN: [id number] Arrival date & time: 09/20/19  1155      History   Chief Complaint Chief Complaint  Patient presents with  . Otalgia  . Sore Throat  . Cough    HPI Maria Mejia is a 49 y.o. female.   Patient presents with ear pain, sore throat, nonproductive cough x2 days.  Treatment attempted at home with Tylenol and ibuprofen.  She denies fever, chills, rash, shortness of breath, abdominal pain, vomiting, diarrhea, or other symptoms.  She is fully vaccinated for COVID.    The history is provided by the patient.    Past Medical History:  Diagnosis Date  . Headache   . Hypertension   . Migraines   . Skin cancer     Patient Active Problem List   Diagnosis Date Noted  . Chest pain 08/01/2019  . Elevated blood pressure reading 08/01/2019  . Lower abdominal pain 06/29/2018  . Dysphagia 11/24/2017  . Encounter for routine adult medical exam with abnormal findings 10/12/2015  . Hypertriglyceridemia 10/12/2015  . Hip pain 10/12/2015  . Anxiety and depression 09/10/2015  . Migraine 08/07/2015  . FH: brain aneurysm 08/07/2015    Past Surgical History:  Procedure Laterality Date  . ARTHOSCOPIC ROTAOR CUFF REPAIR Left   . BREAST REDUCTION SURGERY  2009  . NASAL SEPTUM SURGERY      OB History   No obstetric history on file.      Home Medications    Prior to Admission medications   Medication Sig Start Date End Date Taking? Authorizing Provider  acetaminophen (TYLENOL) 500 MG tablet Take 500 mg by mouth every 6 (six) hours as needed.   Yes [provider]  cetirizine (ZYRTEC) 10 MG tablet Take 10 mg by mouth daily.   Yes [provider]  ibuprofen (ADVIL) 200 MG tablet Take 200 mg by mouth every 6 (six) hours as needed.   Yes [provider]  buPROPion (WELLBUTRIN XL) 150 MG 24 hr tablet Take 1 tablet (150 mg total) by mouth daily. 11/19/18   Pleas Koch, NP  dicyclomine (BENTYL) 10 MG capsule TAKE ONE  CAPSULE BY MOUTH THREE TIMES A DAY BEFORE MEALS AS NEEDED FOR IBS SYMPTOMS 05/10/19   Pleas Koch, NP  Galcanezumab-gnlm (EMGALITY) 120 MG/ML SOAJ Inject 120 mg into the skin every 30 (thirty) days. 11/15/18   Pieter Partridge, DO  levocetirizine (XYZAL) 5 MG tablet Take 5 mg by mouth every evening.    [provider]  losartan (COZAAR) 25 MG tablet Take 1 tablet (25 mg total) by mouth daily. For blood pressure. 08/13/19 08/12/20  Pleas Koch, NP  omeprazole (PRILOSEC) 20 MG capsule Take 1 capsule (20 mg total) by mouth daily. 11/24/17   Pleas Koch, NP  zolmitriptan (ZOMIG-ZMT) 5 MG disintegrating tablet Take 1 tablet earliest onset of migraine.  May repeat in 2 hours if needed. Maximum 2 tablets in 24 hours 11/15/18   Pieter Partridge, DO    Family History Family History  Problem Relation Age of Onset  . Migraines Mother   . Stroke Mother   . Migraines Paternal Uncle   . Alzheimer's disease Maternal Grandfather   . Alzheimer's disease Paternal Grandfather   . Heart attack Father   . Hypertension Father   . Renal cancer Father   . Alzheimer's disease Father     Social History Social History   Tobacco Use  . Smoking status: Never Smoker  .  Smokeless tobacco: Never Used  Vaping Use  . Vaping Use: Never used  Substance Use Topics  . Alcohol use: Yes    Alcohol/week: 1.0 standard drink    Types: 1 Shots of liquor per week    Comment: occcasioally  . Drug use: No     Allergies   Augmentin [amoxicillin-pot clavulanate], Erythromycin, Omnicef [cefdinir], and Zithromax [azithromycin]   Review of Systems Review of Systems  Constitutional: Negative for chills and fever.  HENT: Positive for ear pain and sore throat.   Eyes: Negative for pain and visual disturbance.  Respiratory: Positive for cough. Negative for shortness of breath.   Cardiovascular: Negative for chest pain and palpitations.  Gastrointestinal: Negative for abdominal pain, diarrhea, nausea  and vomiting.  Genitourinary: Negative for dysuria and hematuria.  Musculoskeletal: Negative for arthralgias and back pain.  Skin: Negative for color change and rash.  Neurological: Negative for seizures and syncope.  All other systems reviewed and are negative.    Physical Exam Triage Vital Signs ED Triage Vitals  Enc Vitals Group     BP      Pulse      Resp      Temp      Temp src      SpO2      Weight      Height      Head Circumference      Peak Flow      Pain Score      Pain Loc      Pain Edu?      Excl. in [place]?    No data found.  Updated Vital Signs BP 125/82 (BP Location: Left Arm)   Pulse 78   Temp 99.7 F (37.6 C) (Oral)   Resp 18   SpO2 96%   Visual Acuity Right Eye Distance:   Left Eye Distance:   Bilateral Distance:    Right Eye Near:   Left Eye Near:    Bilateral Near:     Physical Exam Vitals and nursing note reviewed.  Constitutional:      General: She is not in acute distress.    Appearance: She is well-developed. She is not ill-appearing.  HENT:     Head: Normocephalic and atraumatic.     Right Ear: Tympanic membrane normal.     Left Ear: Tympanic membrane normal.     Nose: Nose normal.     Mouth/Throat:     Mouth: Mucous membranes are moist.     Pharynx: Oropharynx is clear.  Eyes:     Conjunctiva/sclera: Conjunctivae normal.  Cardiovascular:     Rate and Rhythm: Normal rate and regular rhythm.     Heart sounds: No murmur heard.   Pulmonary:     Effort: Pulmonary effort is normal. No respiratory distress.     Breath sounds: Normal breath sounds. No wheezing or rhonchi.  Abdominal:     Palpations: Abdomen is soft.     Tenderness: There is no abdominal tenderness. There is no guarding or rebound.  Musculoskeletal:     Cervical back: Neck supple.  Skin:    General: Skin is warm and dry.     Findings: No rash.  Neurological:     General: No focal deficit present.     Mental Status: She is alert and oriented to person, place,  and time.     Gait: Gait normal.  Psychiatric:        Mood and Affect: Mood normal.  Behavior: Behavior normal.      UC Treatments / Results  Labs (all labs ordered are listed, but only abnormal results are displayed) Labs Reviewed  CULTURE, GROUP A STREP ([place])  NOVEL CORONAVIRUS, NAA  POCT RAPID STREP A (OFFICE)    EKG   Radiology No results found.  Procedures Procedures (including critical care time)  Medications Ordered in UC Medications - No data to display  Initial Impression / Assessment and Plan / UC Course  I have reviewed the triage vital signs and the nursing notes.  Pertinent labs & imaging results that were available during my care of the patient were reviewed by me and considered in my medical decision making (see chart for details).   Upper respiratory infection.  Rapid strep negative; throat culture pending.  PCR COVID pending.  Instructed patient to self quarantine until the test result is back discussed symptomatic treatment, including Tylenol, rest, hydration.  Instructed patient to go to the ED if she has acute worsening symptoms.  Instructed her to follow-up with her PCP if her symptoms are not improving.  Patient agrees to plan of care.   Final Clinical Impressions(s) / UC Diagnoses   Final diagnoses:  Upper respiratory tract infection, unspecified type     Discharge Instructions     Your rapid strep test is negative.  A throat culture is pending; we will call you if it is positive requiring treatment.    Your COVID test is pending.  You should self quarantine until the test result is back.    Take Tylenol as needed for fever or discomfort.  Rest and keep yourself hydrated.    Go to the emergency department if you develop acute worsening symptoms.        ED Prescriptions    None     PDMP not reviewed this encounter.   Sharion Balloon, NP 09/20/19 1234

## 2019-09-20 NOTE — Telephone Encounter (Signed)
Spoken and notified patient of Maria Millers comments. Verbalized understanding.  However, patient is not getting much better. I have suggested to patient to go to urgent care because that would be the best next option to be evaluated in person.  Patient got upset because we cannot see her in person. I have try to tell her due to covid, we cannot bring in the office so patient proceeded to hang up the phone.

## 2019-09-20 NOTE — Telephone Encounter (Signed)
Noted, patient seen at [facility] in [place] today.

## 2019-09-20 NOTE — ED Triage Notes (Signed)
Pt presents with sore throat, cough and bilateral ear pain. States sore throt and ear pain is worse when swallows. Pt taking Tylenol and ibuprofen, last dose this today around 7:30 am.

## 2019-09-22 LAB — SARS-COV-2, NAA 2 DAY TAT

## 2019-09-22 LAB — NOVEL CORONAVIRUS, NAA: SARS-CoV-2, NAA: NOT DETECTED

## 2019-09-23 LAB — CULTURE, GROUP A STREP (THRC)

## 2019-09-27 NOTE — Progress Notes (Signed)
NEUROLOGY FOLLOW UP OFFICE NOTE  [name] [id number]  HISTORY OF PRESENT ILLNESS: Maria Mejia is a 49 year old female who follows up for migraine.  UPDATE: Diagnosed with high blood pressure and now on Cozaar.  She also notes some weight gain over the past year or so, despite no change in lifestyle (eats healthy, exercises). Intensity:  Moderate to severe Duration:  No more than an hour with Zomig Frequency: 1 in past 30 days (usually 1 to 2 a month) Frequency of abortive medication: 3 or 4 a month Rescue protocol: Zomig for migraine; Zyrtec or Cold and Sinus medication for sinus headache Current NSAIDS:ibuprofen Current analgesics:none Current triptans:Zomig-ZMT 5mg  Current ergotamine:none Current anti-emetic:none Current muscle relaxants:none Current anti-anxiolytic:none Current sleep aide:none Current Antihypertensive medications:losartan Current Antidepressant medications:Wellbutrin XL Current Anticonvulsant medications:none Current anti-CGRP:Emgality Current Vitamins/Herbal/Supplements:none Current Antihistamines/Decongestants:Zyrtec Other therapy:none Hormone/birth control:Lo Loestrin Fe  Caffeine:Rarely drinks coffee. Drinks 1 energy drink a day. Diet:At least 1/2 gallon water daily. Exercise:yes Depression:mild; Anxiety:no Other pain:no Sleep hygiene:Interrupted.  HISTORY: Onset:Adolescence. A year and a half ago, she started cosmetic Botox around the eyes that was effective for her migraines and was able to get off of topiramate. She hasn't received cosmetic botox since prior to Covid-19 pandemic started. Location:Right retro-orbital Quality:pressure Initial intensity: Moderate to severe.  She denies new headache, thunderclap headache Aura:She once had an ocular migraine presenting as visual aura with flashing lights lasting 30 minutes. No associated headache. Premonitory  Phase:no Postdrome:Migraine hangover for 2 hours Associated symptoms:Photophobia, phonophobia.She rarely has nausea and vomiting. Shedenies associated osmophobia orunilateral numbness or weakness. Initial duration:If treated quickly, it lasts about an hour Initial frequency:At least 9 migraines a month. She reports more mild "sinus headaches" as well (total 15 headache days a month).  Initial frequency of abortive medication:varies Triggers:Change in barometric pressure, artificial sweetener, peanut oil, caffeine withdrawal, menstrual cycle Relieving factors:Caffeine only if with caffeine withdrawal Activity:Aggravates but able to function  She has family history of cerebral aneurysm. Mother passed away from aneurysm rupture in 2001. Her mom's brother and sister have aneurysms as well.   08/20/2015 MRI Brain with and without: 2 punctate nonenhancing nonspecific hyperintense foci in the right frontal white matter 09/18/2015 CTA Head: No aneurysm or other vascular abnormality.  Past NSAIDS:Ibuprofen, naproxen Past analgesics:Tylenol, Excedrin Past abortive triptans:Sumatriptan, rizatriptan Past abortive ergotamine:none Past muscle relaxants:none Past anti-emetic:Zofran ODT 4mg  Past antihypertensive medications:none Past antidepressant medications:none Past anticonvulsant medications:topiramate 100mg  twice daily Past anti-CGRP:none Past vitamins/Herbal/Supplements:none Other past therapies:none   Family history of headache:Mom (migraines); maternal uncle (migraines as young adult)  PAST MEDICAL HISTORY: Past Medical History:  Diagnosis Date  . Headache   . Hypertension   . Migraines   . Skin cancer     MEDICATIONS: Current Outpatient Medications on File Prior to Visit  Medication Sig Dispense Refill  . acetaminophen (TYLENOL) 500 MG tablet Take 500 mg by mouth every 6 (six) hours as needed.    Marland Kitchen buPROPion (WELLBUTRIN  XL) 150 MG 24 hr tablet Take 1 tablet (150 mg total) by mouth daily. 90 tablet 3  . cetirizine (ZYRTEC) 10 MG tablet Take 10 mg by mouth daily.    Marland Kitchen dicyclomine (BENTYL) 10 MG capsule TAKE ONE CAPSULE BY MOUTH THREE TIMES A DAY BEFORE MEALS AS NEEDED FOR IBS SYMPTOMS 30 capsule 0  . Galcanezumab-gnlm (EMGALITY) 120 MG/ML SOAJ Inject 120 mg into the skin every 30 (thirty) days. 1 pen 11  . ibuprofen (ADVIL) 200 MG tablet Take 200 mg by mouth every 6 (  six) hours as needed.    Marland Kitchen levocetirizine (XYZAL) 5 MG tablet Take 5 mg by mouth every evening.    Marland Kitchen losartan (COZAAR) 25 MG tablet Take 1 tablet (25 mg total) by mouth daily. For blood pressure. 90 tablet 3  . omeprazole (PRILOSEC) 20 MG capsule Take 1 capsule (20 mg total) by mouth daily. 90 capsule 0  . zolmitriptan (ZOMIG-ZMT) 5 MG disintegrating tablet Take 1 tablet earliest onset of migraine.  May repeat in 2 hours if needed. Maximum 2 tablets in 24 hours 9 tablet 3   No current facility-administered medications on file prior to visit.    ALLERGIES: Allergies  Allergen Reactions  . Augmentin [Amoxicillin-Pot Clavulanate]     Upset stomach  . Erythromycin Other (See Comments)    Upset stomach  . Omnicef [Cefdinir]     Upset stomach   . Zithromax [Azithromycin]     FAMILY HISTORY: Family History  Problem Relation Age of Onset  . Migraines Mother   . Stroke Mother   . Migraines Paternal Uncle   . Alzheimer's disease Maternal Grandfather   . Alzheimer's disease Paternal Grandfather   . Heart attack Father   . Hypertension Father   . Renal cancer Father   . Alzheimer's disease Father     SOCIAL HISTORY: Social History   Socioeconomic History  . Marital status: Divorced    Spouse name: Not on file  . Number of children: 0  . Years of education: Not on file  . Highest education level: Not on file  Occupational History  . Not on file  Tobacco Use  . Smoking status: Never Smoker  . Smokeless tobacco: Never Used  Vaping  Use  . Vaping Use: Never used  Substance and Sexual Activity  . Alcohol use: Yes    Alcohol/week: 1.0 standard drink    Types: 1 Shots of liquor per week    Comment: occcasioally  . Drug use: No  . Sexual activity: Yes    Partners: Male    Birth control/protection: Pill  Other Topics Concern  . Not on file  Social History Narrative   Right handed       HS graduate      Two story home         Social Determinants of Health   Financial Resource Strain:   . Difficulty of Paying Living Expenses: Not on file  Food Insecurity:   . Worried About Charity fundraiser in the Last Year: Not on file  . Ran Out of Food in the Last Year: Not on file  Transportation Needs:   . Lack of Transportation (Medical): Not on file  . Lack of Transportation (Non-Medical): Not on file  Physical Activity:   . Days of Exercise per Week: Not on file  . Minutes of Exercise per Session: Not on file  Stress:   . Feeling of Stress : Not on file  Social Connections:   . Frequency of Communication with Friends and Family: Not on file  . Frequency of Social Gatherings with Friends and Family: Not on file  . Attends Religious Services: Not on file  . Active Member of Clubs or Organizations: Not on file  . Attends Archivist Meetings: Not on file  . Marital Status: Not on file  Intimate Partner Violence:   . Fear of Current or Ex-Partner: Not on file  . Emotionally Abused: Not on file  . Physically Abused: Not on file  . Sexually Abused: Not  on file    PHYSICAL EXAM: Blood pressure 128/84, pulse 81, height 5\' 3"  (1.6 m), weight 186 lb (84.4 kg), SpO2 96 %. General: No acute distress.  Patient appears well-groomed.   Head:  Normocephalic/atraumatic Eyes:  Fundi examined but not visualized Neck: supple, no paraspinal tenderness, full range of motion Heart:  Regular rate and rhythm Lungs:  Clear to auscultation bilaterally Back: No paraspinal tenderness Neurological Exam: alert and  oriented to person, place, and time. Attention span and concentration intact, recent and remote memory intact, fund of knowledge intact.  Speech fluent and not dysarthric, language intact.  CN II-XII intact. Bulk and tone normal, muscle strength 5/5 throughout.  Sensation to light touch, temperature and vibration intact.  Deep tendon reflexes 2+ throughout, toes downgoing.  Finger to nose and heel to shin testing intact.  Gait normal, Romberg negative.  IMPRESSION: Migraine without aura, without status migrainosus, not intractable  PLAN: 1.  For preventative management, Emgality 2.  For abortive therapy, Zomig-ZMT 5mg  3.  Limit use of pain relievers to no more than 2 days out of week to prevent risk of rebound or medication-overuse headache. 4.  Keep headache diary 5.  Exercise, hydration, caffeine cessation, sleep hygiene, monitor for and avoid triggers 6.  Follow up 9 months.   Metta Clines, DO  CC: Alma Friendly, NP

## 2019-09-30 ENCOUNTER — Ambulatory Visit (INDEPENDENT_AMBULATORY_CARE_PROVIDER_SITE_OTHER): Payer: 59 | Admitting: Neurology

## 2019-09-30 ENCOUNTER — Other Ambulatory Visit: Payer: Self-pay

## 2019-09-30 ENCOUNTER — Encounter: Payer: Self-pay | Admitting: Neurology

## 2019-09-30 VITALS — BP 128/84 | HR 81 | Ht 63.0 in | Wt 186.0 lb

## 2019-09-30 DIAGNOSIS — G43009 Migraine without aura, not intractable, without status migrainosus: Secondary | ICD-10-CM

## 2019-09-30 NOTE — Patient Instructions (Signed)
  1. Continue Emgality monthly 2. Take Zomig at earliest onset of headache.  May repeat dose once in 2 hours if needed.  Maximum 2 tablets in 24 hours. 3. Limit use of pain relievers to no more than 2 days out of the week.  These medications include acetaminophen, NSAIDs (ibuprofen/Advil/Motrin, naproxen/Aleve, triptans (Imitrex/sumatriptan), Excedrin, and narcotics.  This will help reduce risk of rebound headaches. 4. Be aware of common food triggers:  - Caffeine:  coffee, black tea, cola, Mt. Dew  - Chocolate  - Dairy:  aged cheeses (brie, blue, cheddar, gouda, [place], provolone, [place], Swiss, etc), chocolate milk, buttermilk, sour cream, limit eggs and yogurt  - Nuts, peanut butter  - Alcohol  - Cereals/grains:  FRESH breads (fresh bagels, sourdough, doughnuts), yeast productions  - Processed/canned/aged/cured meats (pre-packaged deli meats, hotdogs)  - MSG/glutamate:  soy sauce, flavor enhancer, pickled/preserved/marinated foods  - Sweeteners:  aspartame (Equal, Nutrasweet).  Sugar and Splenda are okay  - Vegetables:  legumes (lima beans, lentils, snow peas, fava beans, pinto peans, peas, garbanzo beans), sauerkraut, onions, olives, pickles  - Fruit:  avocados, bananas, citrus fruit (orange, lemon, grapefruit), mango  - Other:  Frozen meals, macaroni and cheese 5. Routine exercise 6. Stay adequately hydrated (aim for 64 oz water daily) 7. Keep headache diary 8. Maintain proper stress management 9. Maintain proper sleep hygiene 10. Do not skip meals 11. Consider supplements:  magnesium citrate 400mg  daily, riboflavin 400mg  daily, coenzyme Q10 100mg  three times daily.

## 2019-10-03 MED ORDER — DOXYCYCLINE HYCLATE 100 MG PO TABS: 100.0000 mg | ORAL_TABLET | Freq: Two times a day (BID) | ORAL | 0 refills | Status: DC

## 2019-10-11 MED ORDER — PREDNISONE 20 MG PO TABS: ORAL_TABLET | ORAL | 0 refills | Status: DC

## 2019-10-27 NOTE — Telephone Encounter (Signed)
Joellen, will you schedule her for a virtual visit with me so we can discuss? Okay to add on as virtual for Tuesday 09/28 at 12:20 ish.

## 2019-10-29 ENCOUNTER — Other Ambulatory Visit: Payer: Self-pay

## 2019-10-29 ENCOUNTER — Telehealth (INDEPENDENT_AMBULATORY_CARE_PROVIDER_SITE_OTHER): Payer: 59 | Admitting: Primary Care

## 2019-10-29 VITALS — Temp 98.6°F | Ht 63.0 in | Wt 182.0 lb

## 2019-10-29 DIAGNOSIS — J3089 Other allergic rhinitis: Secondary | ICD-10-CM | POA: Diagnosis not present

## 2019-10-29 MED ORDER — MONTELUKAST SODIUM 10 MG PO TABS: 10.0000 mg | ORAL_TABLET | Freq: Every day | ORAL | 0 refills | Status: DC

## 2019-10-29 NOTE — Assessment & Plan Note (Signed)
Symptoms and presentation today suggest postnasal drip secondary to allergies to be contributing to symptoms.  Clearly is not a bacterial issue as she has failed to improve on Augmentin and prednisone.  She cleared her throat nearly every 10 to 30 seconds during our visit today. We will trial Singulair at bedtime, consider Zyrtec in the morning.  She will update in a week.

## 2019-10-29 NOTE — Progress Notes (Signed)
Subjective:    Patient ID: Maria Mejia, female    DOB: 28-Dec-1970, 49 y.o.   MRN: [id number]  HPI  Virtual Visit via Video Note  I connected with Severiano Gilbert on 10/29/19 at 12:40 PM EDT by a video enabled telemedicine application and verified that I am speaking with the correct person using two identifiers.  Location: Patient: Home Provider: Office   I discussed the limitations of evaluation and management by telemedicine and the availability of in person appointments. The patient expressed understanding and agreed to proceed.  History of Present Illness:  Ms. Maria Mejia is a 49 year old female with a history of migraine, hypertension who presents today with a chief complaint of congestion.  She also reports voice hoarseness, post nasal drip, cough, chest congestion. Symptoms began about 6 weeks ago. She started using a Neti-Pot rinse a few days ago and has noticed some improvement. She denies feeling poorly or sick. She is able to work and go to [employer]. She is under a tremendous amount of stress at work, and is questioning whether symptoms are related to stress.  She tends to feel better over the weekend, feels the worst at the end of the week.   She tested negative for strep and Covid-19 in August 2021. She is currently not taking anything OTC for symptoms. She's also completed courses of Doxycycline and prednisone without much improvement.    Observations/Objective:  Alert and oriented. Appears well, not sickly. No distress. Speaking in complete sentences. Clearing her throat every 10-30 seconds during visit.   Assessment and Plan:  Symptoms and presentation today suggest postnasal drip secondary to allergies to be contributing to symptoms.  Clearly is not a bacterial issue as she has failed to improve on Augmentin and prednisone.  She cleared her throat nearly every 10 to 30 seconds during our visit today. We will trial Singulair at bedtime, consider Zyrtec in the morning.  She  will update in a week.  Follow Up Instructions:  Start Singulair nightly for allergies.  You may take Zyrtec or Xyzal during the day.  Please update me in 1 week.  It was a pleasure to see you today! Allie Bossier, NP-C    I discussed the assessment and treatment plan with the patient. The patient was provided an opportunity to ask questions and all were answered. The patient agreed with the plan and demonstrated an understanding of the instructions.   The patient was advised to call back or seek an in-person evaluation if the symptoms worsen or if the condition fails to improve as anticipated.    Pleas Koch, NP    Review of Systems  Constitutional: Negative for fever.  HENT: Positive for congestion and postnasal drip.   Respiratory: Positive for cough.   Allergic/Immunologic: Positive for environmental allergies.       Past Medical History:  Diagnosis Date  . Headache   . Hypertension   . Migraines   . Skin cancer      Social History   Socioeconomic History  . Marital status: Divorced    Spouse name: Not on file  . Number of children: 0  . Years of education: Not on file  . Highest education level: Not on file  Occupational History  . Not on file  Tobacco Use  . Smoking status: Never Smoker  . Smokeless tobacco: Never Used  Vaping Use  . Vaping Use: Never used  Substance and Sexual Activity  . Alcohol use: Yes  Alcohol/week: 1.0 standard drink    Types: 1 Shots of liquor per week    Comment: occcasioally  . Drug use: No  . Sexual activity: Yes    Partners: Male    Birth control/protection: Pill  Other Topics Concern  . Not on file  Social History Narrative   Right handed       HS graduate      Two story home         Social Determinants of Health   Financial Resource Strain:   . Difficulty of Paying Living Expenses: Not on file  Food Insecurity:   . Worried About Charity fundraiser in the Last Year: Not on file  . Ran Out of Food  in the Last Year: Not on file  Transportation Needs:   . Lack of Transportation (Medical): Not on file  . Lack of Transportation (Non-Medical): Not on file  Physical Activity:   . Days of Exercise per Week: Not on file  . Minutes of Exercise per Session: Not on file  Stress:   . Feeling of Stress : Not on file  Social Connections:   . Frequency of Communication with Friends and Family: Not on file  . Frequency of Social Gatherings with Friends and Family: Not on file  . Attends Religious Services: Not on file  . Active Member of Clubs or Organizations: Not on file  . Attends Archivist Meetings: Not on file  . Marital Status: Not on file  Intimate Partner Violence:   . Fear of Current or Ex-Partner: Not on file  . Emotionally Abused: Not on file  . Physically Abused: Not on file  . Sexually Abused: Not on file    Past Surgical History:  Procedure Laterality Date  . ARTHOSCOPIC ROTAOR CUFF REPAIR Left   . BREAST REDUCTION SURGERY  2009  . NASAL SEPTUM SURGERY      Family History  Problem Relation Age of Onset  . Migraines Mother   . Stroke Mother   . Migraines Paternal Uncle   . Alzheimer's disease Maternal Grandfather   . Alzheimer's disease Paternal Grandfather   . Heart attack Father   . Hypertension Father   . Renal cancer Father   . Alzheimer's disease Father     Allergies  Allergen Reactions  . Augmentin [Amoxicillin-Pot Clavulanate]     Upset stomach  . Erythromycin Other (See Comments)    Upset stomach  . Omnicef [Cefdinir]     Upset stomach   . Zithromax [Azithromycin]     Current Outpatient Medications on File Prior to Visit  Medication Sig Dispense Refill  . acetaminophen (TYLENOL) 500 MG tablet Take 500 mg by mouth every 6 (six) hours as needed.    Marland Kitchen buPROPion (WELLBUTRIN XL) 150 MG 24 hr tablet Take 1 tablet (150 mg total) by mouth daily. 90 tablet 3  . cetirizine (ZYRTEC) 10 MG tablet Take 10 mg by mouth daily.    Marland Kitchen dicyclomine  (BENTYL) 10 MG capsule TAKE ONE CAPSULE BY MOUTH THREE TIMES A DAY BEFORE MEALS AS NEEDED FOR IBS SYMPTOMS 30 capsule 0  . Galcanezumab-gnlm (EMGALITY) 120 MG/ML SOAJ Inject 120 mg into the skin every 30 (thirty) days. 1 pen 11  . ibuprofen (ADVIL) 200 MG tablet Take 200 mg by mouth every 6 (six) hours as needed.    Marland Kitchen losartan (COZAAR) 25 MG tablet Take 1 tablet (25 mg total) by mouth daily. For blood pressure. 90 tablet 3  . omeprazole (PRILOSEC)  20 MG capsule Take 1 capsule (20 mg total) by mouth daily. 90 capsule 0  . zolmitriptan (ZOMIG-ZMT) 5 MG disintegrating tablet Take 1 tablet earliest onset of migraine.  May repeat in 2 hours if needed. Maximum 2 tablets in 24 hours 9 tablet 3  . Lactic Ac-Citric Ac-Pot Bitart (PHEXXI) 1.8-1-0.4 % GEL Phexxi 1.8 %-1 %-0.4 % vaginal gel  Insert 5 g every day by vaginal route as needed for 12 days.     No current facility-administered medications on file prior to visit.    Temp 98.6 F (37 C) (Temporal)   Ht 5\' 3"  (1.6 m)   Wt 182 lb (82.6 kg)   BMI 32.24 kg/m    Objective:   Physical Exam Constitutional:      Appearance: She is not ill-appearing.  HENT:     Head:     Comments: Patient clearing her throat frequently during visit. Pulmonary:     Effort: Pulmonary effort is normal.  Neurological:     Mental Status: She is alert and oriented to person, place, and time.            Assessment & Plan:

## 2019-10-29 NOTE — Patient Instructions (Signed)
Start Singulair nightly for allergies.  You may take Zyrtec or Xyzal during the day.  Please update me in 1 week.  It was a pleasure to see you today! Allie Bossier, NP-C

## 2019-11-19 ENCOUNTER — Encounter: Payer: 59 | Admitting: Dermatology

## 2019-11-20 ENCOUNTER — Encounter: Payer: 59 | Admitting: Dermatology

## 2019-11-24 ENCOUNTER — Other Ambulatory Visit: Payer: Self-pay | Admitting: Neurology

## 2019-11-24 ENCOUNTER — Other Ambulatory Visit: Payer: Self-pay | Admitting: Primary Care

## 2019-11-24 DIAGNOSIS — J3089 Other allergic rhinitis: Secondary | ICD-10-CM

## 2019-12-17 ENCOUNTER — Other Ambulatory Visit: Payer: Self-pay | Admitting: Primary Care

## 2019-12-17 DIAGNOSIS — F419 Anxiety disorder, unspecified: Secondary | ICD-10-CM

## 2020-01-08 ENCOUNTER — Ambulatory Visit (INDEPENDENT_AMBULATORY_CARE_PROVIDER_SITE_OTHER): Payer: 59 | Admitting: Dermatology

## 2020-01-08 ENCOUNTER — Encounter: Payer: Self-pay | Admitting: Dermatology

## 2020-01-08 ENCOUNTER — Other Ambulatory Visit: Payer: Self-pay

## 2020-01-08 DIAGNOSIS — L720 Epidermal cyst: Secondary | ICD-10-CM | POA: Diagnosis not present

## 2020-01-08 DIAGNOSIS — L821 Other seborrheic keratosis: Secondary | ICD-10-CM

## 2020-01-08 DIAGNOSIS — D229 Melanocytic nevi, unspecified: Secondary | ICD-10-CM | POA: Diagnosis not present

## 2020-01-08 DIAGNOSIS — L814 Other melanin hyperpigmentation: Secondary | ICD-10-CM

## 2020-01-08 DIAGNOSIS — Z1283 Encounter for screening for malignant neoplasm of skin: Secondary | ICD-10-CM | POA: Diagnosis not present

## 2020-01-08 DIAGNOSIS — L578 Other skin changes due to chronic exposure to nonionizing radiation: Secondary | ICD-10-CM

## 2020-01-08 DIAGNOSIS — Z86018 Personal history of other benign neoplasm: Secondary | ICD-10-CM

## 2020-01-08 DIAGNOSIS — B351 Tinea unguium: Secondary | ICD-10-CM

## 2020-01-08 DIAGNOSIS — D2262 Melanocytic nevi of left upper limb, including shoulder: Secondary | ICD-10-CM

## 2020-01-08 DIAGNOSIS — Z85828 Personal history of other malignant neoplasm of skin: Secondary | ICD-10-CM

## 2020-01-08 DIAGNOSIS — D225 Melanocytic nevi of trunk: Secondary | ICD-10-CM

## 2020-01-08 DIAGNOSIS — D2371 Other benign neoplasm of skin of right lower limb, including hip: Secondary | ICD-10-CM

## 2020-01-08 DIAGNOSIS — D235 Other benign neoplasm of skin of trunk: Secondary | ICD-10-CM

## 2020-01-08 MED ORDER — TERBINAFINE HCL 250 MG PO TABS
250.0000 mg | ORAL_TABLET | Freq: Every day | ORAL | 0 refills | Status: AC
Start: 2020-01-08 — End: 2020-02-07

## 2020-01-08 NOTE — Patient Instructions (Addendum)
Continue doxycycline 100mg  twice a day as prescribed. If cyst under arm not improved, call our office for refill.  Neutrogena Spot Treatment apply to cyst under arm at night or use benzoyl peroxide wash in shower. Risk bleaching.  Doxycycline should be taken with food to prevent nausea. Do not lay down for 30 minutes after taking. Be cautious with sun exposure and use good sun protection while on this medication. Pregnant women should not take this medication.    Terbinafine Counseling  Terbinafine is an anti-fungal medicine that can be applied to the skin (over the counter) or taken by mouth (prescription) to treat fungal infections. The pill version is often used to treat fungal infections of the nails or scalp. While most people do not have any side effects from taking terbinafine pills, some possible side effects of the medicine can include taste changes, headache, loss of smell, vision changes, nausea, vomiting, or diarrhea.   Rare side effects can include irritation of the liver, allergic reaction, or decrease in blood counts (which may show up as not feeling well or developing an infection). If you are concerned about any of these side effects, please stop the medicine and call your doctor, or in the case of an emergency such as feeling very unwell, seek immediate medical care.

## 2020-01-08 NOTE — Progress Notes (Signed)
Follow-Up Visit   Subjective  Maria Mejia is a 49 y.o. female who presents for the following: Annual Exam. She has a knot under her left arm that has been there for 1-2 weeks. Area is getting smaller.  She is on doxycycline 100 mg twice a day for laryngitis for about 5 days now, has 5 days left. She also thinks she has a fungus on her toe on left foot. She has used O'Keeffe's Cream. No history of liver problems.   The following portions of the chart were reviewed this encounter and updated as appropriate:      Review of Systems:  No other skin or systemic complaints except as noted in HPI or Assessment and Plan.  Objective  Well appearing patient in no apparent distress; mood and affect are within normal limits.  A full examination was performed including scalp, head, eyes, ears, nose, lips, neck, chest, axillae, abdomen, back, buttocks, bilateral upper extremities, bilateral lower extremities, hands, feet, fingers, toes, fingernails, and toenails. All findings within normal limits unless otherwise noted below.  Objective  Left 5th Palmar Finger: 3.0 x 2.70mm medium dark Vonseggern macule- no changes when compared to previous photo  Left Abdomen: 6.19mm med dark Speakman macule  Images        Objective  Left Axilla: SQ nodule with erythema, 1.5cm  Objective  L great toenail, L 5th toenail: Onycholysis and yellow discoloration.  Images     Assessment & Plan   Skin cancer screening performed today.  Actinic Damage - chronic, secondary to cumulative UV radiation exposure/sun exposure over time - diffuse scaly erythematous macules with underlying dyspigmentation - Recommend daily broad spectrum sunscreen SPF 30+ to sun-exposed areas, reapply every 2 hours as needed.  - Call for new or changing lesions.  Melanocytic Nevi - Tan-Matsunaga and/or pink-flesh-colored symmetric macules and papules - Benign appearing on exam today - Observation - Call clinic for new or changing  moles - Recommend daily use of broad spectrum spf 30+ sunscreen to sun-exposed areas.   Lentigines - Scattered tan macules - Discussed due to sun exposure - Benign, observe - Recommend daily broad spectrum sunscreen SPF 30+ to sun-exposed areas, reapply every 2 hours as needed. - Call for any changes  Seborrheic Keratoses - Stuck-on, waxy, tan-Bokhari papules and plaques  - Discussed benign etiology and prognosis. - Observe - Call for any changes  History of Basal Cell Carcinoma of the Skin - No evidence of recurrence today of the right ant shin - Recommend regular full body skin exams - Recommend daily broad spectrum sunscreen SPF 30+ to sun-exposed areas, reapply every 2 hours as needed.  - Call if any new or changing lesions are noted between office visits History of Dysplastic Nevus - No evidence of recurrence today of the right superior shoulder - Recommend regular full body skin exams - Recommend daily broad spectrum sunscreen SPF 30+ to sun-exposed areas, reapply every 2 hours as needed.  - Call if any new or changing lesions are noted between office visits Dermatofibroma - Firm pink/Meiners papulenodule with dimple sign of the right calf, left mid back - Benign appearing - Call for any changes  Milia - tiny firm white papules, including R medial canthus - type of cyst - benign - may be extracted if symptomatic - observe - Discussed using Differin 0.1% gel qhs to entire face or spot treat.  Nevus (2) Left Abdomen; Left 5th Palmar Finger  Benign-appearing.  Stable compared to picture and note from last  visit. Observation.  Call clinic for new or changing moles.  Recommend daily use of broad spectrum spf 30+ sunscreen to sun-exposed areas.    Epidermal inclusion cyst Left Axilla  Inflamed  Continue doxycycline 100mg  BID as previously prescribed for bronchitis. If not improving, will call for refill.  Neutrogena Spot Treament Apply to AA qhs until improved, risk  bleaching. May use BP wash in the shower.  Doxycycline should be taken with food to prevent nausea. Do not lay down for 30 minutes after taking. Be cautious with sun exposure and use good sun protection while on this medication. Pregnant women should not take this medication.    Tinea unguium L great toenail, L 5th toenail  LFTs labs from 11/19/2018 wnl  Start terbinafine 250mg  take 1 po QD with food dsp #30 0Rf. Will need 3 month course Patient will wait until bronchitis is improved before starting.  Terbinafine Counseling  Terbinafine is an anti-fungal medicine that can be applied to the skin (over the counter) or taken by mouth (prescription) to treat fungal infections. The pill version is often used to treat fungal infections of the nails or scalp. While most people do not have any side effects from taking terbinafine pills, some possible side effects of the medicine can include taste changes, headache, loss of smell, vision changes, nausea, vomiting, or diarrhea.   Rare side effects can include irritation of the liver, allergic reaction, or decrease in blood counts (which may show up as not feeling well or developing an infection). If you are concerned about any of these side effects, please stop the medicine and call your doctor, or in the case of an emergency such as feeling very unwell, seek immediate medical care.    terbinafine (LAMISIL) 250 MG tablet - L great toenail, L 5th toenail  Return in about 6 weeks (around 02/19/2020) for tinea.  IJamesetta Orleans, CMA, am acting as scribe for Brendolyn Patty, MD .  Documentation: I have reviewed the above documentation for accuracy and completeness, and I agree with the above.  Brendolyn Patty MD

## 2020-01-22 ENCOUNTER — Encounter: Payer: 59 | Admitting: Dermatology

## 2020-02-13 ENCOUNTER — Other Ambulatory Visit: Payer: Self-pay | Admitting: Dermatology

## 2020-02-13 DIAGNOSIS — B351 Tinea unguium: Secondary | ICD-10-CM

## 2020-02-21 ENCOUNTER — Other Ambulatory Visit: Payer: Self-pay | Admitting: Primary Care

## 2020-02-21 DIAGNOSIS — F32A Depression, unspecified: Secondary | ICD-10-CM

## 2020-02-24 ENCOUNTER — Other Ambulatory Visit: Payer: Self-pay

## 2020-02-24 ENCOUNTER — Encounter: Payer: Self-pay | Admitting: Dermatology

## 2020-02-24 MED ORDER — TERBINAFINE HCL 250 MG PO TABS: 250.0000 mg | ORAL_TABLET | Freq: Every day | ORAL | 0 refills | Status: DC

## 2020-02-24 NOTE — Telephone Encounter (Signed)
Okay to send in 1 rf Lamisil 250 mg

## 2020-02-25 ENCOUNTER — Ambulatory Visit: Payer: 59 | Admitting: Dermatology

## 2020-03-05 ENCOUNTER — Ambulatory Visit: Payer: 59 | Attending: Otolaryngology | Admitting: Speech Pathology

## 2020-03-05 ENCOUNTER — Other Ambulatory Visit: Payer: Self-pay

## 2020-03-05 ENCOUNTER — Encounter: Payer: Self-pay | Admitting: Speech Pathology

## 2020-03-05 DIAGNOSIS — R490 Dysphonia: Secondary | ICD-10-CM | POA: Diagnosis present

## 2020-03-05 DIAGNOSIS — R498 Other voice and resonance disorders: Secondary | ICD-10-CM | POA: Diagnosis present

## 2020-03-05 NOTE — Patient Instructions (Signed)
I recommend you continue your voice rest for at least 4 more weeks as we begin voice therapy, with potential to gradually increase voice-related activities if you are maintaining good vocal function.      VOICE CONSERVATION PROGRAM  1. Avoid overuse of voice or excessive use of the voice . The vocal cords can become easily fatigued. Try to sort out what is "necessary" versus "unnecessary" talking in your environment. You do not need to STOP talking, but try to limit it as much as possible.  . Think of resting your voice just as long as you talk. For example, if you talk for 5 minutes, rest your voice completely for 5 minutes. . If your voice feels "tired" during the day, try to rest it as much as possible.  2. Avoid using an excessively loud voice or shouting/raising your voice . When you yell or raise your voice, the vocal cords slam into each other, much like a strong hand clap. This causes irritation, and if this irritation continues, hoarseness may increase. . If people in your home talk loudly, ask them to reduce the volume of their voices to help you decrease your volume as well. . Sit near or face the person to whom you are speaking.   3. Avoid talking over background noise . When talking in background noise, speech automatically has increased loudness, and as in number 2 above, continued loud speech can result in increased hoarseness due to irritation to the vocal cords.  . Do not talk over the radio or the TV. Mute them before speaking, go to a quieter place to talk, or sit next to the person with whom you are watching.  4. Talk in a voice that is soft, smooth, and gentle . This allows the vocal cords to come together in a gentle way and allows the air being exhaled from the lungs to do most/all of the work when you are speaking.  5. Avoid excessive throat clearing, coughing, and loud laughter . During these activities the vocal cords can also be slammed together in a hard way and  foster irritation or swelling, which can cause hoarseness. . Try taking sips of room temperature/cool water with hard swallows to clear secretions from the throat, instead of throat clearing or coughing. . If you absolutely must clear your throat, do so as gently as possible. If you find yourself clearing your throat or coughing a lot, consult your physician. . You will want to laugh. Continue to do so, but softly and gently. Do not laugh loudly for long periods of time because this can increase the chances for vocal fold irritation and thus hoarseness. . Lozenges can help reduce the need to clear throat/cough during cold/allergy season.  6. Keep the mouth and throat lubricated . Drink at least 8-10 8 oz. glasses of water per day (64-80 oz.). This water can come in the form of drink mixes.  . Caffeinated beverages such as colas, coffee, and tea (hot tea AND sweet tea) dry out the vocal cords and then can cause irritation and thus hoarseness.  . Drinking water will keep your body hydrated. This will help to decrease secretions in the throat, which cause people to clear their throats or cough. . If you are exercising outside (especially during drier weather), try to breathe through your nose as much as possible. If this is not possible, lifting the tongue up behind the upper teeth when breathing through the mouth will add some moisture to the air  breathed in past the vocal cords.  7. Avoid mouth breathing - breathe through your nose . Your nose serves as a natural filter for dust and dirt particles from the air, and as a humidifier to moisten the air. Vocal cords like to work in moistened, filtered air. When you breathe through your mouth you lose the air filtering and moistening benefits of breathing through the nose. Marland Kitchen Be aware of breathing patterns when sitting quietly (e.g., reading or watching TV). Increase your awareness and try to change habits from mouth breathing to nose breathing during those  times.  8. Avoid environmental and/or ingested irritants . Try to avoid smoke-filled and or dusty environments. These items dry out the vocal cords and cause irritation.  9. Use an air filter if the home is dusty, and/or a humidifier if the air is dry  . This will help to maintain clean, humid air to breathe. Remember, this type of air is what the vocal cords like best.

## 2020-03-05 NOTE — Therapy (Signed)
[place] [facility] SERVICES [address] [place], [place], [address] Phone: [phone]   Fax:  [phone]  Speech Language Pathology Evaluation  Patient Details  Name: Maria Mejia MRN: [id number] Date of Birth: 1970/06/22 Referring Provider (SLP): Clyde Canterbury, MD   Encounter Date: 03/05/2020   End of Session - 03/05/20 1724    Visit Number 1    Number of Visits 17    Date for SLP Re-Evaluation 05/04/20    Authorization Type AET[employer]    Authorization - Visit Number 1    Progress Note Due on Visit 10    SLP Start Time 1600    SLP Stop Time  1700    SLP Time Calculation (min) 60 min    Activity Tolerance Patient tolerated treatment well           Past Medical History:  Diagnosis Date  . Basal cell carcinoma 03/29/2017   Right anterior shin. Superficial.  . Dysplastic nevus 03/29/2017   Right superior shoulder. Severe atypia and halo effect, edges free. Excised 05/17/2017, margins free.  Marland Kitchen Headache   . Hypertension   . Migraines   . Skin cancer     Past Surgical History:  Procedure Laterality Date  . ARTHOSCOPIC ROTAOR CUFF REPAIR Left   . BREAST REDUCTION SURGERY  2009  . NASAL SEPTUM SURGERY      There were no vitals filed for this visit.   Subjective Assessment - 03/05/20 1710    Subjective Was put on 30 days vocal rest by ENT; persistent hoarseness since brochitis in August 2021    Currently in Pain? No/denies              SLP Evaluation [facility] - 03/05/20 1710      SLP Visit Information   SLP Received On 03/05/20    Referring Provider (SLP) Clyde Canterbury, MD    Onset Date August 2021    Medical Diagnosis R49.0 Hoarseness      General Information   HPI Maria Mejia is a 50 y.o. female referred for evaluation for hoarseness by her ENT Dr. Richardson Landry. This has been present since August 2021, when she had bronchitis. 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 Laryngoscopy on 01/02/20 showed "TVC mobile and free of nodules, just some mild edema. 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  No    Is the patient reluctant to leave their home because of a fear of falling?  No      Prior Functional Status   Cognitive/Linguistic Baseline Within functional limits    Vocation Full time employment   Sales     Cognition   Overall Cognitive Status Within Functional Limits for tasks assessed      Auditory Comprehension   Overall Auditory Comprehension Appears within functional limits for tasks assessed      Visual Recognition/Discrimination   Discrimination Not tested      Reading Comprehension   Reading Status Within funtional limits      Expression   Primary Mode of Expression Verbal  Verbal Expression   Overall Verbal Expression Appears within functional limits for tasks assessed      Written Expression   Dominant Hand Right    Written Expression Not tested      Oral Motor/Sensory Function   Overall Oral Motor/Sensory Function Appears within functional limits for tasks assessed      Motor Speech   Overall Motor Speech Impaired    Respiration Impaired    Level of Impairment Phrase    Phonation Low vocal intensity;Hoarse   pitch breaks, intermittent aphonia, rough, strained   Resonance Within functional limits    Articulation Within functional limitis    Intelligibility Intelligible    Motor Planning Witnin functional limits    Phonation Impaired    Vocal Abuses Habitual Cough/Throat Clear;Prolonged Vocal Use    Tension Present Neck;Shoulder    Volume Soft;Decibel Level   61-71 dB range from 30  cm   Pitch --   pitch breaks, inconsistent     Standardized Assessments   Standardized Assessments  Other Assessment        Perceptual Voice Evaluation    Voice Case History     Health risks:    Minimal; caffeine use approx. 6-8 oz daily, daily H20 intake average  64 oz, pt is a nonsmoker    Characteristic voice use: Currently 3-4 hours on weekdays (prior to vocal rest this was much more) and 2-3 hours on weekend days. Needs to make sales calls, presentations, participate in meetings. Pt does [employer] and may use her voice/grunt during exercises   Environmental risks: stressful environment   Misuse: excessive voice use, chest-centered breathing, tension/strain   Phonotraumatic behaviors: throat clearing, speaks over noise occasionally   Vocal characteristics: hoarseness, rough/strained vocal quality, reduced vocal intensity, vocal fatigue, pitch breaks, and difficulty projecting  Objective Voice Measurements   Maximum phonation time for sustained "ah": 6.3 seconds   Average fundamental frequency during sustained "ah":    (186 HZ, >2 SD below average of  244 Hz +/- 27 for gender)    Habitual pitch: 224 Hz  Highest dynamic pitch in conversational speech: 387 Hz   Lowest dynamic pitch in conversational speech: 152 Hz   Average time patient was able to sustain /s/:  11.9 sec   Average time patient was able to sustain /z/: 9 sec   s/z ratio:  (suggestive of dysfunction > 1.0) 1.3   VR-QOL Score:  (29 /50) (10-15 =excellent, 16-20=very good, 21-25 = good, 26-30 = fair, 30+ = poor)            SLP Education - 03/05/20 1724    Education Details voice conservation/vocal hygiene    Person(s) Educated Patient    Methods Explanation;Handout    Comprehension Verbalized understanding            SLP Short Term Goals - 03/05/20 1725      SLP SHORT TERM GOAL #1   Title Patient will report carryover of at least 3 vocal hygiene procedures x 3 sessions.    Time 10     Period --   sessions   Status New      SLP SHORT TERM GOAL #2   Title Patient will use abdominal breathing in sentence level tasks 90% accuracy.    Time 10   sessions     SLP SHORT TERM GOAL #3   Title Patient will perform voice HEP independently.    Time 10    Period --   sessions   Status New  SLP Long Term Goals - 03/05/20 1727      SLP LONG TERM GOAL #1   Title Patient will report carryover 4+ vocal hygiene procedures outside ST x 5 visits.    Time 8    Period Weeks    Status New    Target Date 05/04/20      SLP LONG TERM GOAL #2   Title Patient will use abdominal breathing >85% of the time in 15 minutes mod complex conversation x 3 visits.    Time 8    Period Weeks    Status New    Target Date 05/04/20      SLP LONG TERM GOAL #3   Title Patient will demonstrate adequate vocal quality and endurance using trained vocal techniques in 20 minutes mod complex conversation x3 visits.    Time 8    Period Weeks    Status New    Target Date 05/04/20      SLP LONG TERM GOAL #4   Title Patient will report improved voice-related quality of life to at least "good" as measured by V-RQOL.    Baseline 29 = Fair on 03/05/20    Time 8    Period Weeks    Status New    Target Date 05/04/20      SLP LONG TERM GOAL #5   Title Pt will increase MPT to >10 seconds.    Baseline 6.3 on 03/05/20    Time 8    Period Weeks    Status New    Target Date 05/04/20            Plan - 03/05/20 1734    Clinical Impression Statement Patient presents with moderate dysphonia characterized by hoarseness, rough/strained vocal quality, reduced vocal intensity, vocal fatigue, pitch breaks, and difficulty projecting. Breathing pattern is chest-centered, with neck/shoulder tension and intermittent throat clearing noted during phonation tasks. 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 I recommend  skilled ST to train patient in vocal hygiene, abdominal breathing, as well as other vocal techniques to improve vocal quality and endurance to meet demands of work and home.    Speech Therapy Frequency 2x / week    Duration 8 weeks    Treatment/Interventions Environmental controls;Cueing hierarchy;SLP instruction and feedback;Functional tasks;Patient/family education;Other (comment)    Potential to Achieve Goals Good    SLP Home Exercise Plan see pt instructions    Consulted and Agree with Plan of Care Patient           Patient will benefit from skilled therapeutic intervention in order to improve the following deficits and impairments:   Dysphonia  Other voice and resonance disorders    Problem List Patient Active Problem List   Diagnosis Date Noted  . Environmental and seasonal allergies 10/29/2019  . Chest pain 08/01/2019  . Elevated blood pressure reading 08/01/2019  . Lower abdominal pain 06/29/2018  . Dysphagia 11/24/2017  . Encounter for routine adult medical exam with abnormal findings 10/12/2015  . Hypertriglyceridemia 10/12/2015  . Hip pain 10/12/2015  . Anxiety and depression 09/10/2015  . Migraine 08/07/2015  . FH: brain aneurysm 08/07/2015   Deneise Lever, [place], [place] 03/05/2020, 5:48 PM  [place] [facility] SERVICES [address] [place], [place], [address] Phone: [phone]   Fax:  [phone]  Name: Livana Oberly MRN:  [id number] Date of Birth: [birth year]

## 2020-03-09 ENCOUNTER — Ambulatory Visit: Payer: 59 | Admitting: Speech Pathology

## 2020-03-09 DIAGNOSIS — R498 Other voice and resonance disorders: Secondary | ICD-10-CM

## 2020-03-09 DIAGNOSIS — R490 Dysphonia: Secondary | ICD-10-CM | POA: Diagnosis not present

## 2020-03-09 NOTE — Patient Instructions (Addendum)
Abdominal Breathing : 15 minutes, twice a day   . Shoulders down - this is a cue to relax . Place your hand on your abdomen - this helps you focus on easy abdominal breath support - the best and most relaxed way to breathe . Breathe in through your nose and fill your belly with air, watching your hand move outward . Breathe out through your mouth and watch your belly move in. An audible "sh"  may help   Think of your belly as a balloon, when you fill with air (inhale), the balloon gets bigger. As the air goes out (exhale), the balloon deflates.  If you are having difficulty coordinating this, lay on your back with a plastic cup on your belly and repeat the above steps, watching your belly move up with inhalation and down with exhalations  Practice breathing in and out in front of a mirror, watching your belly Breathe in for a count of 5 and breathe out for a count of 5  Neck Stretches:  Lean to L side: hold 10 seconds Lean to R side: hold 10 seconds Look left: hold 10 Look right: hold 10  Look up: hold 10  Oral resonance exercises on MedBridge: Access Code: [email or web address] URL: [email or web address] Date: 03/09/2020 Prepared by: Benito Mccreedy  Exercises Hum - Sustained - 2 x daily - 7 x weekly - 1 sets - 10 reps Hum - Ascending Glide - 2 x daily - 7 x weekly - 1 sets - 10 reps Hum - Vowels - 2 x daily - 7 x weekly - 1 sets - 10 reps Hum - Descending Glide - 2 x daily - 7 x weekly - 1 sets - 10 reps Stretch and Flow Phonation - Descending Glide - 2 x daily - 7 x weekly - 1 sets - 10 reps Fading Hum - Reset Words - 2 x daily - 7 x weekly - 1 sets - 10 reps    [name]

## 2020-03-10 NOTE — Therapy (Signed)
[place] [facility] SERVICES [address] [place], [place], [address] Phone: [phone]   Fax:  [phone]  Speech Language Pathology Treatment  Patient Details  Name: Maria Mejia MRN: [id number] Date of Birth: [birth year] Referring Provider (SLP): Clyde Canterbury, MD   Encounter Date: 03/09/2020   End of Session - 03/09/20 1600   Visit Number 2    Number of Visits 17    Date for SLP Re-Evaluation 05/04/20    Authorization Type AETNA    Authorization - Visit Number 2    Progress Note Due on Visit 10    SLP Start Time 1600    SLP Stop Time  1700    SLP Time Calculation (min) 60 min    Activity Tolerance Patient tolerated treatment well           Past Medical History:  Diagnosis Date  . Basal cell carcinoma 03/29/2017   Right anterior shin. Superficial.  . Dysplastic nevus 03/29/2017   Right superior shoulder. Severe atypia and halo effect, edges free. Excised 05/17/2017, margins free.  Marland Kitchen Headache   . Hypertension   . Migraines   . Skin cancer     Past Surgical History:  Procedure Laterality Date  . ARTHOSCOPIC ROTAOR CUFF REPAIR Left   . BREAST REDUCTION SURGERY  2009  . NASAL SEPTUM SURGERY      There were no vitals filed for this visit.   Subjective Assessment - 03/09/20 1600   Subjective "Will it get better on it's own?"    Currently in Pain? No/denies                 ADULT SLP TREATMENT - 03/09/20 1600     General Information   Behavior/Cognition Alert;Cooperative      Treatment Provided   Treatment provided Cognitive-Linquistic      Cognitive-Linquistic Treatment   Treatment focused on Voice;Patient/family/caregiver education    Skilled Treatment Reviewed vocal hygiene guidelines with patient and vocal rest. Introduced confidential voice to minimize strain during voice use. Patient able to attend only 1x weekly; SLP generated routine for daily home practice and reviewed these exercises; provided handout  and video links. Abdominal breathing in isolation improved from 60% accuracy to 80% over 3 minutes with min cues (pt used mirror for visualization). Responded well to resonant voice exercises to improve vocal quality. Reported difficulty coordinating these with abdominal breathing however showing some emerging success with this, anticipate this will improve with home practice. Hum and nasalized syllables ~70% accuracy, progressed to nasalized words with occasional min-mod A.      Assessment / Recommendations / Plan   Plan Continue with current plan of care      Progression Toward Goals   Progression toward goals Progressing toward goals            SLP Education - 03/09/20 1600   Education Details HEP    Person(s) Educated Patient    Methods Explanation;Handout    Comprehension Verbalized understanding            SLP Short Term Goals - 03/05/20 1725      SLP SHORT TERM GOAL #1   Title Patient will report carryover of at least 3 vocal hygiene procedures x 3 sessions.    Time 10    Period --   sessions   Status New      SLP SHORT TERM GOAL #2   Title Patient will use abdominal breathing in sentence level tasks 90% accuracy.  Time 10   sessions     SLP SHORT TERM GOAL #3   Title Patient will perform voice HEP independently.    Time 10    Period --   sessions   Status New            SLP Long Term Goals - 03/05/20 1727      SLP LONG TERM GOAL #1   Title Patient will report carryover 4+ vocal hygiene procedures outside ST x 5 visits.    Time 8    Period Weeks    Status New    Target Date 05/04/20      SLP LONG TERM GOAL #2   Title Patient will use abdominal breathing >85% of the time in 15 minutes mod complex conversation x 3 visits.    Time 8    Period Weeks    Status New    Target Date 05/04/20      SLP LONG TERM GOAL #3   Title Patient will demonstrate adequate vocal quality and endurance using trained vocal techniques in 20 minutes mod complex conversation  x3 visits.    Time 8    Period Weeks    Status New    Target Date 05/04/20      SLP LONG TERM GOAL #4   Title Patient will report improved voice-related quality of life to at least "good" as measured by V-RQOL.    Baseline 29 = Fair on 03/05/20    Time 8    Period Weeks    Status New    Target Date 05/04/20      SLP LONG TERM GOAL #5   Title Pt will increase MPT to >10 seconds.    Baseline 6.3 on 03/05/20    Time 8    Period Weeks    Status New    Target Date 05/04/20            Plan - 03/09/20 1600   Clinical Impression Statement Patient presents with moderate dysphonia characterized by hoarseness, rough/strained vocal quality, reduced vocal intensity, vocal fatigue, pitch breaks, and difficulty projecting. Abdominal breathing ~70% in isolation over 3 minutes today. Pt demos emerging awareness of oral resonance vs throaty focus in syllable and word level tasks. Throat clears x3, of which pt aware of 1. I recommend skilled ST to train patient in vocal hygiene, abdominal breathing, as well as other vocal techniques to improve vocal quality and endurance to meet demands of work and home.    Speech Therapy Frequency 2x / week    Duration 8 weeks    Treatment/Interventions Environmental controls;Cueing hierarchy;SLP instruction and feedback;Functional tasks;Patient/family education;Other (comment)    Potential to Achieve Goals Good    SLP Home Exercise Plan see pt instructions    Consulted and Agree with Plan of Care Patient           Patient will benefit from skilled therapeutic intervention in order to improve the following deficits and impairments:   Dysphonia  Other voice and resonance disorders    Problem List Patient Active Problem List   Diagnosis Date Noted  . Environmental and seasonal allergies 10/29/2019  . Chest pain 08/01/2019  . Elevated blood pressure reading 08/01/2019  . Lower abdominal pain 06/29/2018  . Dysphagia 11/24/2017  . Encounter for routine  adult medical exam with abnormal findings 10/12/2015  . Hypertriglyceridemia 10/12/2015  . Hip pain 10/12/2015  . Anxiety and depression 09/10/2015  . Migraine 08/07/2015  . FH: brain aneurysm 08/07/2015  Deneise Lever, [place], CCC-SLP Speech-Language Pathologist  Aliene Altes 03/10/2020, 9:54 AM  [place] [facility] SERVICES [address] [place], [place], [address] Phone: [phone]   Fax:  [phone]   Name: Maria Mejia MRN: [id number] Date of Birth: 23-Sep-1970

## 2020-03-11 ENCOUNTER — Ambulatory Visit: Payer: 59 | Admitting: Speech Pathology

## 2020-03-17 ENCOUNTER — Other Ambulatory Visit: Payer: Self-pay

## 2020-03-17 ENCOUNTER — Ambulatory Visit: Payer: 59 | Admitting: Speech Pathology

## 2020-03-17 DIAGNOSIS — R490 Dysphonia: Secondary | ICD-10-CM

## 2020-03-17 DIAGNOSIS — R498 Other voice and resonance disorders: Secondary | ICD-10-CM

## 2020-03-17 NOTE — Therapy (Signed)
[place] [facility] SERVICES [address] [place], [place], [address] Phone: [phone]   Fax:  [phone]  Speech Language Pathology Treatment  Patient Details  Name: Maria Mejia MRN: [id number] Date of Birth: [birth year] Referring Provider (SLP): Clyde Canterbury, MD   Encounter Date: 03/17/2020   End of Session - 03/17/20 1804    Visit Number 3    Number of Visits 17    Date for SLP Re-Evaluation 05/04/20    Authorization Type AETNA    Authorization - Visit Number 3    Progress Note Due on Visit 10    SLP Start Time 1604    SLP Stop Time  1700    SLP Time Calculation (min) 56 min    Activity Tolerance Patient tolerated treatment well           Past Medical History:  Diagnosis Date  . Basal cell carcinoma 03/29/2017   Right anterior shin. Superficial.  . Dysplastic nevus 03/29/2017   Right superior shoulder. Severe atypia and halo effect, edges free. Excised 05/17/2017, margins free.  Marland Kitchen Headache   . Hypertension   . Migraines   . Skin cancer     Past Surgical History:  Procedure Laterality Date  . ARTHOSCOPIC ROTAOR CUFF REPAIR Left   . BREAST REDUCTION SURGERY  2009  . NASAL SEPTUM SURGERY      There were no vitals filed for this visit.   Subjective Assessment - 03/17/20 1611    Subjective "The sound of my voice was definitely worse by the end of Sunday."    Currently in Pain? Yes    Pain Score 5     Pain Location Epigastric                 ADULT SLP TREATMENT - 03/17/20 1757      General Information   Behavior/Cognition Alert;Cooperative    HPI Maria Mejia is a 50 y.o. female referred for evaluation for hoarseness by her ENT Dr. Richardson Landry. This has been present since August 2021, when she had bronchitis. 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 Laryngoscopy on  01/02/20 showed "TVC mobile and free of nodules, just some mild edema. 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 Has been under a lot of stress with work. Neck and supplemental vocal tract relaxation stretches with occasional min cues. Pt reports having a hard time coordinating breathing with phonation for exercises. Spent session using mat for training in abdominal breathing in supine position, gradually adding phonation and word level tasks (nasalized). Pt able to attain resonant, forward focus 80% of the time, coordinate with AB 70% accuracy (mod cues for breath holding) while in supine. Some carryover with phrases.      Assessment / Recommendations / Plan   Plan Continue with current plan of care      Progression Toward Goals   Progression toward goals Progressing toward goals            SLP Education - 03/17/20 1804    Education Details practice in supine, if  struggling with a task return to AB    Person(s) Educated Patient    Methods Explanation    Comprehension Verbalized understanding            SLP Short Term Goals - 03/05/20 1725      SLP SHORT TERM GOAL #1   Title Patient will report carryover of at least 3 vocal hygiene procedures x 3 sessions.    Time 10    Period --   sessions   Status New      SLP SHORT TERM GOAL #2   Title Patient will use abdominal breathing in sentence level tasks 90% accuracy.    Time 10   sessions     SLP SHORT TERM GOAL #3   Title Patient will perform voice HEP independently.    Time 10    Period --   sessions   Status New            SLP Long Term Goals - 03/05/20 1727      SLP LONG TERM GOAL #1   Title  Patient will report carryover 4+ vocal hygiene procedures outside ST x 5 visits.    Time 8    Period Weeks    Status New    Target Date 05/04/20      SLP LONG TERM GOAL #2   Title Patient will use abdominal breathing >85% of the time in 15 minutes mod complex conversation x 3 visits.    Time 8    Period Weeks    Status New    Target Date 05/04/20      SLP LONG TERM GOAL #3   Title Patient will demonstrate adequate vocal quality and endurance using trained vocal techniques in 20 minutes mod complex conversation x3 visits.    Time 8    Period Weeks    Status New    Target Date 05/04/20      SLP LONG TERM GOAL #4   Title Patient will report improved voice-related quality of life to at least "good" as measured by V-RQOL.    Baseline 29 = Fair on 03/05/20    Time 8    Period Weeks    Status New    Target Date 05/04/20      SLP LONG TERM GOAL #5   Title Pt will increase MPT to >10 seconds.    Baseline 6.3 on 03/05/20    Time 8    Period Weeks    Status New    Target Date 05/04/20            Plan - 03/17/20 1805    Clinical Impression Statement Patient presents with moderate dysphonia characterized by hoarseness, rough/strained vocal quality, reduced vocal intensity, vocal fatigue, pitch breaks, and difficulty projecting. No throat clears today. Benefitted from instruction in supine position for abdominal breathing and coordination with resonant voicing at word level. I recommend skilled ST to train patient in vocal hygiene, abdominal breathing, as well as other vocal techniques to improve vocal quality and endurance to meet demands of work and home.    Speech Therapy Frequency 2x / week   pt attending 1x a week due to cost   Duration 8 weeks    Treatment/Interventions Environmental controls;Cueing hierarchy;SLP instruction and feedback;Functional tasks;Patient/family education;Other (comment)    Potential to Achieve Goals Good    SLP Home Exercise Plan see pt instructions     Consulted and Agree with Plan of Care Patient  Patient will benefit from skilled therapeutic intervention in order to improve the following deficits and impairments:   Dysphonia  Other voice and resonance disorders    Problem List Patient Active Problem List   Diagnosis Date Noted  . Environmental and seasonal allergies 10/29/2019  . Chest pain 08/01/2019  . Elevated blood pressure reading 08/01/2019  . Lower abdominal pain 06/29/2018  . Dysphagia 11/24/2017  . Encounter for routine adult medical exam with abnormal findings 10/12/2015  . Hypertriglyceridemia 10/12/2015  . Hip pain 10/12/2015  . Anxiety and depression 09/10/2015  . Migraine 08/07/2015  . FH: brain aneurysm 08/07/2015   Deneise Lever, [place], [place] Speech-Language Pathologist  Aliene Altes 03/17/2020, 6:06 PM  [place] [facility] SERVICES [address] [place], [place], [address] Phone: [phone]   Fax:  [phone]   Name: Ellar Hakala MRN: [id number] Date of Birth: 06-27-1970

## 2020-03-24 ENCOUNTER — Ambulatory Visit: Payer: 59 | Admitting: Speech Pathology

## 2020-03-24 ENCOUNTER — Other Ambulatory Visit: Payer: Self-pay

## 2020-03-24 DIAGNOSIS — R498 Other voice and resonance disorders: Secondary | ICD-10-CM

## 2020-03-24 DIAGNOSIS — R490 Dysphonia: Secondary | ICD-10-CM

## 2020-03-24 NOTE — Patient Instructions (Signed)
Start your practice with stretches.  Move to breathing. Lie down, then try partially reclined.  Do your humming  (Try an "mmmhmmm" first to find your pitch).  Abdominal breath and "Shhhhhhhhhhhh" We-eat-eggs-every-Easter. Gus is a pretty boy. Come inside. Are you ready to eat? Go lay down. Get off me! Do you need to go potty? Leave her alone. Don't pee on her.   (add any other common phrases you say that you can think of).  You can also practice with your word lists.  If it comes out wrong, try to fix it.

## 2020-03-24 NOTE — Therapy (Signed)
[place] MAIN [facility] SERVICES [address] [place], [place], [address] Phone: [phone]   Fax:  [phone]  Speech Language Pathology Treatment  Patient Details  Name: Maria Mejia MRN: [id number] Date of Birth: August 08, 1970 Referring Provider (SLP): Clyde Canterbury, MD   Encounter Date: 03/24/2020   End of Session - 03/24/20 1721    Visit Number 4    Number of Visits 17    Date for SLP Re-Evaluation 05/04/20    Authorization Type AETNA    Authorization - Visit Number 4    Progress Note Due on Visit 10    SLP Start Time 1600    SLP Stop Time  2585    SLP Time Calculation (min) 52 min    Activity Tolerance Patient tolerated treatment well           Past Medical History:  Diagnosis Date  . Basal cell carcinoma 03/29/2017   Right anterior shin. Superficial.  . Dysplastic nevus 03/29/2017   Right superior shoulder. Severe atypia and halo effect, edges free. Excised 05/17/2017, margins free.  Marland Kitchen Headache   . Hypertension   . Migraines   . Skin cancer     Past Surgical History:  Procedure Laterality Date  . ARTHOSCOPIC ROTAOR CUFF REPAIR Left   . BREAST REDUCTION SURGERY  2009  . NASAL SEPTUM SURGERY      There were no vitals filed for this visit.   Subjective Assessment - 03/24/20 1709    Subjective Was able to practice words and phrases with breath at home    Currently in Pain? No/denies                 ADULT SLP TREATMENT - 03/24/20 1709      General Information   Behavior/Cognition Alert;Cooperative    HPI Maria Mejia is a 50 y.o. female referred for evaluation for hoarseness by her ENT Dr. Richardson Landry. This has been present since August 2021, when she had bronchitis. 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 Laryngoscopy on 01/02/20 showed "TVC mobile and free of nodules, just  some mild edema. 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 Was able to get good vocal quality with words and a few short phrases. Demo'd forward resonant focus with good vocal quality, albeit more chest-centered breathing. Able to ID and correct vocal quality at word and short phrase level rare min cues. Read non-nasalized words and short phrases 95% accuracy with forward resonant focus, 100% with self-correction. Pt aware of chest vs abdominal breathing. Worked with pt on neck/laryngeal relaxation with stretches. Pt lifts weights and complains of tight trapezius muscles; encouraged her to check with trainer and monitor for any excess neck/throat tension when lifting, also to avoid any grunting or voice strain when lifting. Pt better able to achieve abdominal breathing in partially reclined position with legs elevated. Progressed to /sh/ and finally words, personally relevant phrases with initial mod A fading to rare min A for breath holding, awareness of chest vs. abdominal breath.      Assessment / Recommendations / Plan   Plan Continue with current plan of care  Progression Toward Goals   Progression toward goals Progressing toward goals            SLP Education - 03/24/20 1720    Education Details partially reclined abdominal breathing, adding phonation tasks    Person(s) Educated Patient    Methods Explanation    Comprehension Verbalized understanding            SLP Short Term Goals - 03/05/20 1725      SLP SHORT TERM GOAL #1   Title Patient will report carryover of at least 3 vocal hygiene procedures x 3 sessions.    Time 10    Period --   sessions   Status New      SLP SHORT TERM GOAL #2   Title Patient will use  abdominal breathing in sentence level tasks 90% accuracy.    Time 10   sessions     SLP SHORT TERM GOAL #3   Title Patient will perform voice HEP independently.    Time 10    Period --   sessions   Status New            SLP Long Term Goals - 03/05/20 1727      SLP LONG TERM GOAL #1   Title Patient will report carryover 4+ vocal hygiene procedures outside ST x 5 visits.    Time 8    Period Weeks    Status New    Target Date 05/04/20      SLP LONG TERM GOAL #2   Title Patient will use abdominal breathing >85% of the time in 15 minutes mod complex conversation x 3 visits.    Time 8    Period Weeks    Status New    Target Date 05/04/20      SLP LONG TERM GOAL #3   Title Patient will demonstrate adequate vocal quality and endurance using trained vocal techniques in 20 minutes mod complex conversation x3 visits.    Time 8    Period Weeks    Status New    Target Date 05/04/20      SLP LONG TERM GOAL #4   Title Patient will report improved voice-related quality of life to at least "good" as measured by V-RQOL.    Baseline 29 = Fair on 03/05/20    Time 8    Period Weeks    Status New    Target Date 05/04/20      SLP LONG TERM GOAL #5   Title Pt will increase MPT to >10 seconds.    Baseline 6.3 on 03/05/20    Time 8    Period Weeks    Status New    Target Date 05/04/20            Plan - 03/24/20 1725    Clinical Impression Statement Patient continues with moderate dysphonia characterized by hoarseness, rough/strained vocal quality, reduced vocal intensity, vocal fatigue, pitch breaks, and difficulty projecting. Emerging ability to attain clear vocal quality at word and phrase with resonant voice techniques, however continues to require cuing with abdominal breathing and for more natural prosody when using these techniques. I recommend skilled ST to train patient in vocal hygiene, abdominal breathing, as well as other vocal techniques to improve vocal quality and endurance  to meet demands of work and home.    Speech Therapy Frequency 2x / week   pt attending 1x a week due to cost   Duration 8 weeks    Treatment/Interventions Environmental controls;Cueing  hierarchy;SLP instruction and feedback;Functional tasks;Patient/family education;Other (comment)    Potential to Achieve Goals Good    SLP Home Exercise Plan see pt instructions    Consulted and Agree with Plan of Care Patient           Patient will benefit from skilled therapeutic intervention in order to improve the following deficits and impairments:   Dysphonia  Other voice and resonance disorders    Problem List Patient Active Problem List   Diagnosis Date Noted  . Environmental and seasonal allergies 10/29/2019  . Chest pain 08/01/2019  . Elevated blood pressure reading 08/01/2019  . Lower abdominal pain 06/29/2018  . Dysphagia 11/24/2017  . Encounter for routine adult medical exam with abnormal findings 10/12/2015  . Hypertriglyceridemia 10/12/2015  . Hip pain 10/12/2015  . Anxiety and depression 09/10/2015  . Migraine 08/07/2015  . FH: brain aneurysm 08/07/2015   Deneise Lever, [place], CCC-SLP Speech-Language Pathologist   Aliene Altes 03/24/2020, 5:28 PM  [place] MAIN [facility] SERVICES [address] [place], [place], [address] Phone: [phone]   Fax:  [phone]   Name: Launi Asencio MRN: [id number] Date of Birth: 09-Sep-1970

## 2020-03-25 ENCOUNTER — Ambulatory Visit (INDEPENDENT_AMBULATORY_CARE_PROVIDER_SITE_OTHER): Payer: 59 | Admitting: Dermatology

## 2020-03-25 ENCOUNTER — Other Ambulatory Visit: Payer: Self-pay

## 2020-03-25 ENCOUNTER — Encounter: Payer: Self-pay | Admitting: Dermatology

## 2020-03-25 DIAGNOSIS — B351 Tinea unguium: Secondary | ICD-10-CM

## 2020-03-25 MED ORDER — TERBINAFINE HCL 250 MG PO TABS: 250.0000 mg | ORAL_TABLET | Freq: Every day | ORAL | 0 refills | Status: DC

## 2020-03-25 MED ORDER — CICLOPIROX 8 % EX SOLN: Freq: Every day | CUTANEOUS | 0 refills | Status: DC

## 2020-03-25 NOTE — Patient Instructions (Signed)
Terbinafine Counseling ° °Terbinafine is an anti-fungal medicine that can be applied to the skin (over the counter) or taken by mouth (prescription) to treat fungal infections. The pill version is often used to treat fungal infections of the nails or scalp. While most people do not have any side effects from taking terbinafine pills, some possible side effects of the medicine can include taste changes, headache, loss of smell, vision changes, nausea, vomiting, or diarrhea.  ° °Rare side effects can include irritation of the liver, allergic reaction, or decrease in blood counts (which may show up as not feeling well or developing an infection). If you are concerned about any of these side effects, please stop the medicine and call your doctor, or in the case of an emergency such as feeling very unwell, seek immediate medical care.  ° °

## 2020-03-25 NOTE — Progress Notes (Signed)
   Follow-Up Visit   Subjective  Maria Mejia is a 50 y.o. female who presents for the following: Follow-up (8 week recheck. Toenail fungus. L great toenail, L 5th toenail. Taking Terbinafine 250mg  daily. Has been taking for ~6 weeks. Patient reports nails look better. ). Patient reports no side effects while taking Terbinafine. Tolerating it well.    The following portions of the chart were reviewed this encounter and updated as appropriate:      Review of Systems: No other skin or systemic complaints except as noted in HPI or Assessment and Plan.  Objective  Well appearing patient in no apparent distress; mood and affect are within normal limits.  A focused examination was performed including face, hands, left foot/toenails. Relevant physical exam findings are noted in the Assessment and Plan.  Objective  Left 1st toenail, left 5th toenail: Yellowish discoloration with separation at left 1st toenail, left 5th toenail with thickening. Photo compared- looks about the same  Assessment & Plan  Tinea unguium Left 1st toenail, left 5th toenail  No clinical change, but still too early to tell since just 6 weeks into treatment course.  Toenails can take up to a year to grow out Finish Terbinafine 250mg  daily for full 3 month course. Start Penlac nail lacquer qhs as directed  Terbinafine Counseling  Terbinafine is an anti-fungal medicine that can be applied to the skin (over the counter) or taken by mouth (prescription) to treat fungal infections. The pill version is often used to treat fungal infections of the nails or scalp. While most people do not have any side effects from taking terbinafine pills, some possible side effects of the medicine can include taste changes, headache, loss of smell, vision changes, nausea, vomiting, or diarrhea.   Rare side effects can include irritation of the liver, allergic reaction, or decrease in blood counts (which may show up as not feeling well or  developing an infection). If you are concerned about any of these side effects, please stop the medicine and call your doctor, or in the case of an emergency such as feeling very unwell, seek immediate medical care.    ciclopirox (PENLAC) 8 % solution - Left 1st toenail, left 5th toenail  terbinafine (LAMISIL) 250 MG tablet - Left 1st toenail, left 5th toenail  Return in about 4 months (around 07/23/2020) for recheck toenail fungus.   I, Emelia Salisbury, CMA, am acting as scribe for Brendolyn Patty, MD.  Documentation: I have reviewed the above documentation for accuracy and completeness, and I agree with the above.  Brendolyn Patty MD

## 2020-03-29 ENCOUNTER — Other Ambulatory Visit: Payer: Self-pay | Admitting: Neurology

## 2020-03-31 ENCOUNTER — Ambulatory Visit: Payer: 59 | Admitting: Speech Pathology

## 2020-04-07 ENCOUNTER — Ambulatory Visit: Payer: 59 | Attending: Internal Medicine | Admitting: Speech Pathology

## 2020-04-07 ENCOUNTER — Other Ambulatory Visit: Payer: Self-pay

## 2020-04-07 DIAGNOSIS — R498 Other voice and resonance disorders: Secondary | ICD-10-CM | POA: Diagnosis present

## 2020-04-07 DIAGNOSIS — R490 Dysphonia: Secondary | ICD-10-CM | POA: Insufficient documentation

## 2020-04-07 NOTE — Patient Instructions (Signed)
Make a sign or send an email to let your coworkers know that you need voice rest.  Practice breathing and do a warm up before you know you need to have a conversation.  If you start to get hoarse, let the other person know you need to take a break.   Rest your voice for as long as you talk immediately after using it.    Semi-occluded vocal tract exercises (SOVTE)  These allow your vocal folds to vibrate without excess tension and promotes high placement of the voice  Use SOVTE as a warm up before prolonged speaking and vocal exercises   High resistance: voicing through a stirring straw  Medium resistance: voicing through a drinking straw : for 1-2 minutes with abdominal breath  Less resistance: Voiced /v/                            Lip or Tongue Trill                            Nasal "hums" /m/ and /n/                            Vowels /u/ and ee  Watch Vocal Straw Exercises with Lolita Cram on YouTube:  [email or web address]  Pitch Glides for 2 minutes  Accents (siren)  Hum Twinkle Twinkle Little Star or a song you like  A goal would be 2-3 minutes several times a day and prior to vocal exercises  As always, use good belly breathing while completing SOVTE

## 2020-04-07 NOTE — Therapy (Signed)
[place] MAIN [facility] SERVICES [address] [place], [place], [address] Phone: [phone]   Fax:  [phone]  Speech Language Pathology Treatment  Patient Details  Name: Maria Mejia MRN: [id number] Date of Birth: [birth year] Referring Provider (SLP): Clyde Canterbury, MD   Encounter Date: 04/07/2020   End of Session - 04/07/20 1700    Visit Number 5    Number of Visits 17    Date for SLP Re-Evaluation 05/04/20    Authorization Type AETNA    Authorization - Visit Number 5    Progress Note Due on Visit 10    SLP Start Time 2585    SLP Stop Time  2778    SLP Time Calculation (min) 57 min    Activity Tolerance Patient tolerated treatment well           Past Medical History:  Diagnosis Date  . Basal cell carcinoma 03/29/2017   Right anterior shin. Superficial.  . Dysplastic nevus 03/29/2017   Right superior shoulder. Severe atypia and halo effect, edges free. Excised 05/17/2017, margins free.  Marland Kitchen Headache   . Hypertension   . Migraines   . Skin cancer     Past Surgical History:  Procedure Laterality Date  . ARTHOSCOPIC ROTAOR CUFF REPAIR Left   . BREAST REDUCTION SURGERY  2009  . NASAL SEPTUM SURGERY      There were no vitals filed for this visit.   Subjective Assessment - 04/07/20 1657    Subjective "I had a voice for 5 minutes"    Currently in Pain? No/denies                 ADULT SLP TREATMENT - 04/07/20 1653      General Information   Behavior/Cognition Alert;Cooperative    HPI Maria Mejia is a 50 y.o. female referred for evaluation for hoarseness by her ENT Dr. Richardson Landry. This has been present since August 2021, when she had bronchitis. 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 Laryngoscopy on 01/02/20 showed "TVC mobile and free of nodules, just some mild edema. 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 Speaking with strained voice in initial part of session. After neck stretches, abdominal breathing, hum (mod I), progressed to her everyday phrases with rare min cues, good vocal quality, forward focus. Progressed to sentence level 85% accuracy, min cues. Targeted short spontaneous responses, with occasional min A for awareness of speaking on residual capacity. Able to progress to brief periods of conversation with AB/resonant focus 80% accuracy. Required rest breaks ~ every 20 minutes. Trained in [place] exercises, initial mod cues fading to supervision. Pt discussed decreasing frequency due to cost; she is off next week for work trip but we will discuss and revise our plan next visit.      Assessment / Recommendations / Plan   Plan Continue with current plan of care      Progression Toward Goals   Progression toward goals Progressing toward goals  SLP Short Term Goals - 03/05/20 1725      SLP SHORT TERM GOAL #1   Title Patient will report carryover of at least 3 vocal hygiene procedures x 3 sessions.    Time 10    Period --   sessions   Status New      SLP SHORT TERM GOAL #2   Title Patient will use abdominal breathing in sentence level tasks 90% accuracy.    Time 10   sessions     SLP SHORT TERM GOAL #3   Title Patient will perform voice HEP independently.    Time 10    Period --   sessions   Status New            SLP Long Term Goals - 03/05/20 1727      SLP LONG TERM GOAL #1   Title Patient will report carryover 4+ vocal hygiene procedures outside ST x 5  visits.    Time 8    Period Weeks    Status New    Target Date 05/04/20      SLP LONG TERM GOAL #2   Title Patient will use abdominal breathing >85% of the time in 15 minutes mod complex conversation x 3 visits.    Time 8    Period Weeks    Status New    Target Date 05/04/20      SLP LONG TERM GOAL #3   Title Patient will demonstrate adequate vocal quality and endurance using trained vocal techniques in 20 minutes mod complex conversation x3 visits.    Time 8    Period Weeks    Status New    Target Date 05/04/20      SLP LONG TERM GOAL #4   Title Patient will report improved voice-related quality of life to at least "good" as measured by V-RQOL.    Baseline 29 = Fair on 03/05/20    Time 8    Period Weeks    Status New    Target Date 05/04/20      SLP LONG TERM GOAL #5   Title Pt will increase MPT to >10 seconds.    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 Improving ability to maintain abdominal breathing, resonant focus with some carryover to simple conversation today. Continues to require frequent rest breaks due to fatigue. I recommend skilled ST to train patient in vocal hygiene, abdominal breathing, as well as other vocal techniques to improve vocal quality and endurance to meet demands of work and home.    Speech Therapy Frequency 2x / week   pt attending 1x a week due to cost   Duration 8 weeks    Treatment/Interventions Environmental controls;Cueing hierarchy;SLP instruction and feedback;Functional tasks;Patient/family education;Other (comment)    Potential to Achieve Goals Good    SLP Home Exercise Plan see pt instructions    Consulted and Agree with Plan of Care Patient            Patient will benefit from skilled therapeutic intervention in order to improve the following deficits and impairments:   Dysphonia  Other voice and resonance disorders    Problem  List Patient Active Problem List   Diagnosis Date Noted  . Environmental and seasonal allergies 10/29/2019  . Chest pain 08/01/2019  . Elevated blood pressure reading 08/01/2019  . Lower abdominal pain 06/29/2018  . Dysphagia 11/24/2017  . Encounter for routine adult medical exam with abnormal findings 10/12/2015  . Hypertriglyceridemia 10/12/2015  . Hip pain 10/12/2015  . Anxiety and depression 09/10/2015  . Migraine 08/07/2015  . FH: brain aneurysm 08/07/2015   Deneise Lever, [place], CCC-SLP Speech-Language Pathologist  Aliene Altes 04/07/2020, 5:00 PM  [place] MAIN [facility] SERVICES [address] [place], [place], [address] Phone: [phone]   Fax:  [phone]   Name: Maria Mejia MRN: [id number] Date of Birth: 1970-02-08

## 2020-04-14 ENCOUNTER — Ambulatory Visit: Payer: 59 | Admitting: Speech Pathology

## 2020-04-20 DIAGNOSIS — G43701 Chronic migraine without aura, not intractable, with status migrainosus: Secondary | ICD-10-CM

## 2020-04-20 MED ORDER — ZOLMITRIPTAN 5 MG PO TBDP: ORAL_TABLET | ORAL | 3 refills | Status: DC

## 2020-04-21 ENCOUNTER — Ambulatory Visit: Payer: 59 | Admitting: Speech Pathology

## 2020-04-25 ENCOUNTER — Other Ambulatory Visit: Payer: Self-pay | Admitting: Neurology

## 2020-05-04 ENCOUNTER — Ambulatory Visit: Payer: 59 | Attending: Internal Medicine | Admitting: Speech Pathology

## 2020-05-04 ENCOUNTER — Other Ambulatory Visit: Payer: Self-pay

## 2020-05-04 DIAGNOSIS — R498 Other voice and resonance disorders: Secondary | ICD-10-CM | POA: Diagnosis present

## 2020-05-04 DIAGNOSIS — R490 Dysphonia: Secondary | ICD-10-CM | POA: Insufficient documentation

## 2020-05-04 NOTE — Therapy (Signed)
[place] MAIN [facility] SERVICES [address] [place], [place], [address] Phone: [phone]   Fax:  [phone]  Speech Language Pathology Treatment  Patient Details  Name: Maria Mejia MRN: [id number] Date of Birth: 1970-07-23 Referring Provider (SLP): Maria Canterbury, MD   Encounter Date: 05/04/2020   End of Session - 05/04/20 1729    Visit Number 6    Number of Visits 17    Date for SLP Re-Evaluation 05/04/20    Authorization Type AETNA    Authorization - Visit Number 6    Progress Note Due on Visit 10    SLP Start Time 1603    SLP Stop Time  1700    SLP Time Calculation (min) 57 min    Activity Tolerance Patient tolerated treatment well           Past Medical History:  Diagnosis Date  . Basal cell carcinoma 03/29/2017   Right anterior shin. Superficial.  . Dysplastic nevus 03/29/2017   Right superior shoulder. Severe atypia and halo effect, edges free. Excised 05/17/2017, margins free.  Marland Kitchen Headache   . Hypertension   . Migraines   . Skin cancer     Past Surgical History:  Procedure Laterality Date  . ARTHOSCOPIC ROTAOR CUFF REPAIR Left   . BREAST REDUCTION SURGERY  2009  . NASAL SEPTUM SURGERY      There were no vitals filed for this visit.   Subjective Assessment - 05/04/20 1721    Subjective "In [place] by the end of the week I was struggling."    Currently in Pain? No/denies                 ADULT SLP TREATMENT - 05/04/20 1721      General Information   Behavior/Cognition Alert;Cooperative    HPI Maria Mejia is a 50 y.o. female referred for evaluation for hoarseness by her ENT Dr. Richardson Mejia. This has been present since August 2021, when she had bronchitis. 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 Laryngoscopy on 01/02/20 showed "TVC mobile and free of nodules, just some  mild edema. Mild pachyderma/edma of the posterior glottis typical of LPR."      Treatment Provided   Treatment provided Cognitive-Linquistic      Pain Assessment   Pain Assessment No/denies pain      Cognitive-Linquistic Treatment   Treatment focused on Voice;Patient/family/caregiver education    Skilled Treatment Patient entered treatment room with hoarse, strained vocal quality. Reports allergies with pollen. Earlier today, rated her voice 8/10, with 10 being normal voice, but this afternoon she rates as 6/10. Used the phone a lot for work last week. Work is eager for her to begin doing some voice-related duties; SLP advised pt not to take this on too quickly, and not with the strained voice she was using at beginning of session. With relaxation and abdominal breathing, pt was able to reduce strain and improve vocal quality. Used resonant hum and word level tasks to initiate more relaxed, resonant voicing. Pt able to progress to reading 2-3 sentences with initial mod cues for breath pacing. As task progressed, able to fade cues to rare min A, and pt with emerging carryover to short conversation 5-8 minutes maintaining good voicing, minimal strain 80% of the time. 3 throat clears today.      Assessment / Recommendations / Plan  Plan Continue with current plan of care   pt will attend 1x every other week due to cost     Progression Toward Goals   Progression toward goals Progressing toward goals            SLP Education - 05/04/20 1728    Education Details need regular practice to habituate vocal techniques    Person(s) Educated Patient    Methods Explanation    Comprehension Verbalized understanding            SLP Short Term Goals - 03/05/20 1725      SLP SHORT TERM GOAL #1   Title Patient will report carryover of at least 3 vocal hygiene procedures x 3 sessions.    Time 10    Period --   sessions   Status New      SLP SHORT TERM GOAL #2   Title Patient will use abdominal  breathing in sentence level tasks 90% accuracy.    Time 10   sessions     SLP SHORT TERM GOAL #3   Title Patient will perform voice HEP independently.    Time 10    Period --   sessions   Status New            SLP Long Term Goals - 03/05/20 1727      SLP LONG TERM GOAL #1   Title Patient will report carryover 4+ vocal hygiene procedures outside ST x 5 visits.    Time 8    Period Weeks    Status New    Target Date 05/04/20      SLP LONG TERM GOAL #2   Title Patient will use abdominal breathing >85% of the time in 15 minutes mod complex conversation x 3 visits.    Time 8    Period Weeks    Status New    Target Date 05/04/20      SLP LONG TERM GOAL #3   Title Patient will demonstrate adequate vocal quality and endurance using trained vocal techniques in 20 minutes mod complex conversation x3 visits.    Time 8    Period Weeks    Status New    Target Date 05/04/20      SLP LONG TERM GOAL #4   Title Patient will report improved voice-related quality of life to at least "good" as measured by V-RQOL.    Baseline 29 = Fair on 03/05/20    Time 8    Period Weeks    Status New    Target Date 05/04/20      SLP LONG TERM GOAL #5   Title Pt will increase MPT to >10 seconds.    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 Improving ability to maintain abdominal breathing, resonant focus with 80% carryover to 5-8 minutes conversations today. I recommend skilled ST to train patient in vocal hygiene, abdominal breathing, as well as other vocal techniques to improve vocal quality and endurance to meet demands of work and home.    Speech Therapy Frequency 2x / week   pt attending 1x a week due to  cost   Duration 8  weeks    Treatment/Interventions Environmental controls;Cueing hierarchy;SLP instruction and feedback;Functional tasks;Patient/family education;Other (comment)    Potential to Achieve Goals Good    SLP Home Exercise Plan see pt instructions    Consulted and Agree with Plan of Care Patient           Patient will benefit from skilled therapeutic intervention in order to improve the following deficits and impairments:   Dysphonia  Other voice and resonance disorders    Problem List Patient Active Problem List   Diagnosis Date Noted  . Environmental and seasonal allergies 10/29/2019  . Chest pain 08/01/2019  . Elevated blood pressure reading 08/01/2019  . Lower abdominal pain 06/29/2018  . Dysphagia 11/24/2017  . Encounter for routine adult medical exam with abnormal findings 10/12/2015  . Hypertriglyceridemia 10/12/2015  . Hip pain 10/12/2015  . Anxiety and depression 09/10/2015  . Migraine 08/07/2015  . FH: brain aneurysm 08/07/2015   Deneise Lever, [place], [place] Speech-Language Pathologist  Aliene Altes 05/04/2020, 5:30 PM  [place] MAIN [facility] SERVICES [address] [place], [place], [address] Phone: [phone]   Fax:  [phone]   Name: Maria Mejia MRN: [id number] Date of Birth: Apr 16, 1970

## 2020-05-04 NOTE — Patient Instructions (Signed)
I recommend continuing vocal rest as much as you are able. If you can maintain good vocal quality without strain, it's OK to use your voice for short periods. Start with 20-30 minutes at a time, with equal amount of rest afterwards. If this goes well, you can gradually increase the time up to an hour.

## 2020-05-18 ENCOUNTER — Ambulatory Visit: Payer: 59 | Admitting: Speech Pathology

## 2020-05-25 ENCOUNTER — Other Ambulatory Visit: Payer: Self-pay | Admitting: Neurology

## 2020-05-25 ENCOUNTER — Ambulatory Visit: Payer: 59 | Admitting: Speech Pathology

## 2020-05-25 ENCOUNTER — Other Ambulatory Visit: Payer: Self-pay

## 2020-05-25 DIAGNOSIS — R490 Dysphonia: Secondary | ICD-10-CM

## 2020-05-25 DIAGNOSIS — R498 Other voice and resonance disorders: Secondary | ICD-10-CM

## 2020-05-25 NOTE — Therapy (Signed)
[place] MAIN [facility] SERVICES [address] [place], [place], [address] Phone: [phone]   Fax:  [phone]  Speech Language Pathology Treatment  Patient Details  Name: Maria Mejia MRN: [id number] Date of Birth: October 05, 1970 Referring Provider (SLP): Clyde Canterbury, MD   Encounter Date: 05/25/2020   End of Session - 05/25/20 1711    Visit Number 7    Number of Visits 17    Date for SLP Re-Evaluation 05/04/20    Authorization Type AETNA    Authorization - Visit Number 7    Progress Note Due on Visit 10    SLP Start Time 1555    SLP Stop Time  1650    SLP Time Calculation (min) 55 min    Activity Tolerance Patient tolerated treatment well           Past Medical History:  Diagnosis Date  . Basal cell carcinoma 03/29/2017   Right anterior shin. Superficial.  . Dysplastic nevus 03/29/2017   Right superior shoulder. Severe atypia and halo effect, edges free. Excised 05/17/2017, margins free.  Marland Kitchen Headache   . Hypertension   . Migraines   . Skin cancer     Past Surgical History:  Procedure Laterality Date  . ARTHOSCOPIC ROTAOR CUFF REPAIR Left   . BREAST REDUCTION SURGERY  2009  . NASAL SEPTUM SURGERY      There were no vitals filed for this visit.   Subjective Assessment - 05/25/20 1707    Subjective Greets SLP with hoarse vocal quality    Currently in Pain? No/denies                 ADULT SLP TREATMENT - 05/25/20 1707      General Information   Behavior/Cognition Alert;Cooperative    HPI Maria Mejia is a 50 y.o. female referred for evaluation for hoarseness by her ENT Dr. Richardson Landry. This has been present since August 2021, when she had bronchitis. 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 Laryngoscopy on 01/02/20 showed "TVC mobile and free of nodules, just some mild edema. 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 Pt completed relaxation and began abdominal breathing (AB) with initial cue. Able to achieve oral resonant focus and use AB in conversation, reported feeling "robotic" initially but was able to progress to more natural speech as session progressed. Progressed from initial conversation of 6 min to 12 min, and finally 20 min, maintaining AB ~90% of the time and good vocal quality >95% of the time. Pt task is for self-correction in conversations at home; she is to start with individuals she feels comfortable with like friends and sister.      Assessment / Recommendations / Plan   Plan Continue with current plan of care      Progression Toward Goals   Progression toward goals Progressing toward goals            SLP Education - 05/25/20 1711    Education Details consistent practice/ self-correction necessary for carryover    Person(s) Educated Patient    Methods Explanation  Comprehension Verbalized understanding            SLP Short Term Goals - 03/05/20 1725      SLP SHORT TERM GOAL #1   Title Patient will report carryover of at least 3 vocal hygiene procedures x 3 sessions.    Time 10    Period --   sessions   Status New      SLP SHORT TERM GOAL #2   Title Patient will use abdominal breathing in sentence level tasks 90% accuracy.    Time 10   sessions     SLP SHORT TERM GOAL #3   Title Patient will perform voice HEP independently.    Time 10    Period --   sessions   Status New            SLP Long Term Goals - 03/05/20 1727      SLP LONG TERM GOAL #1   Title Patient will report carryover 4+ vocal hygiene procedures outside ST x 5  visits.    Time 8    Period Weeks    Status New    Target Date 05/04/20      SLP LONG TERM GOAL #2   Title Patient will use abdominal breathing >85% of the time in 15 minutes mod complex conversation x 3 visits.    Time 8    Period Weeks    Status New    Target Date 05/04/20      SLP LONG TERM GOAL #3   Title Patient will demonstrate adequate vocal quality and endurance using trained vocal techniques in 20 minutes mod complex conversation x3 visits.    Time 8    Period Weeks    Status New    Target Date 05/04/20      SLP LONG TERM GOAL #4   Title Patient will report improved voice-related quality of life to at least "good" as measured by V-RQOL.    Baseline 29 = Fair on 03/05/20    Time 8    Period Weeks    Status New    Target Date 05/04/20      SLP LONG TERM GOAL #5   Title Pt will increase MPT to >10 seconds.    Baseline 6.3 on 03/05/20    Time 8    Period Weeks    Status New    Target Date 05/04/20            Plan - 05/25/20 1712    Clinical Impression Statement Patient initially responded in hoarse/strained voice but is able to maintain good vocal quality and endurance in progressively longer conversations up to 20 min with min cues. Struggles to use this voice consistently outside of therapy but is demonstrating improvements and self-awareness; ongoing training necessary for carryover. I recommend skilled ST to train patient in vocal hygiene, abdominal breathing, as well as other vocal techniques to improve vocal quality and endurance to meet demands of work and home.    Speech Therapy Frequency 2x / week   pt attending 1x a week due to cost   Duration 8 weeks    Treatment/Interventions Environmental controls;Cueing hierarchy;SLP instruction and feedback;Functional tasks;Patient/family education;Other (comment)    Potential to Achieve Goals Good    SLP Home Exercise Plan see pt instructions    Consulted and Agree with Plan of Care Patient           Patient will  benefit from skilled therapeutic intervention in order to improve the following  deficits and impairments:   Dysphonia  Other voice and resonance disorders    Problem List Patient Active Problem List   Diagnosis Date Noted  . Environmental and seasonal allergies 10/29/2019  . Chest pain 08/01/2019  . Elevated blood pressure reading 08/01/2019  . Lower abdominal pain 06/29/2018  . Dysphagia 11/24/2017  . Encounter for routine adult medical exam with abnormal findings 10/12/2015  . Hypertriglyceridemia 10/12/2015  . Hip pain 10/12/2015  . Anxiety and depression 09/10/2015  . Migraine 08/07/2015  . FH: brain aneurysm 08/07/2015   Deneise Lever, [place], CCC-SLP Speech-Language Pathologist  Aliene Altes 05/25/2020, 5:14 PM  [place] [facility] SERVICES [address] [place], [place], [address] Phone: [phone]   Fax:  [phone]   Name: Maria Mejia MRN: [id number] Date of Birth: 1970-12-20

## 2020-05-26 ENCOUNTER — Encounter: Payer: Self-pay | Admitting: Neurology

## 2020-05-26 NOTE — Progress Notes (Addendum)
Maria Mejia - PA Case ID: [id number] - Rx #: [id number] Need help? Call us at [phone] Outcome Approvedtoday Your PA request has been approved. Additional information will be provided in the approval communication. (Message 1145) Drug Emgality 120MG /ML auto-injectors (migraine) Form [employer] Electronic PA Form [phone] NCPDP) Original Claim Info [id number]  Valid from 05/26/20 to 05/26/21.

## 2020-06-08 ENCOUNTER — Ambulatory Visit: Payer: 59 | Admitting: Speech Pathology

## 2020-06-11 ENCOUNTER — Ambulatory Visit: Payer: 59 | Admitting: Speech Pathology

## 2020-06-22 ENCOUNTER — Ambulatory Visit: Payer: 59 | Admitting: Speech Pathology

## 2020-06-24 ENCOUNTER — Other Ambulatory Visit: Payer: Self-pay | Admitting: Neurology

## 2020-06-24 NOTE — Telephone Encounter (Signed)
Patient to keep her appt on 06/30/20 to get further refills

## 2020-06-26 NOTE — Progress Notes (Signed)
NEUROLOGY FOLLOW UP OFFICE NOTE  Maria Mejia [id number]  Assessment/Plan:   1.  Migraine without aura, without status migrainosus, not intractable 2.  Family history of aneurysm.    1.  Migraine prevention:  Emgality 2.  Migraine rescue:  Zomig-ZMT 5mg  3.  Limit use of pain relievers to no more than 2 days out of week to prevent risk of rebound or medication-overuse headache. 4.  Keep headache diary 5.  Will check MRA of head.  If negative, would repeat in 10 years. 6.  Follow up one year  Subjective:  Maria Mejia is a 50year old female with HTN who follows up for migraine.  UPDATE: Intensity:Moderate to severe Duration:No more than an hour with Zomig Frequency:5 or 6 migraines since starting Emgality Frequency of abortive medication:3 or 4 a month Rescue protocol: Zomig for migraine; Zyrtec or Cold and Sinus medication for sinus headache Current NSAIDS:ibuprofen, acetaminophen Current analgesics:none Current triptans:Zomig-ZMT 5mg  Current ergotamine:none Current anti-emetic:none Current muscle relaxants:none Current anti-anxiolytic:none Current sleep aide:none Current Antihypertensive medications:losartan Current Antidepressant medications:Wellbutrin XL Current Anticonvulsant medications:none Current anti-CGRP:Emgality Current Vitamins/Herbal/Supplements:none Current Antihistamines/Decongestants:Zyrtec Other therapy:none Hormone/birth control:Lo Loestrin Fe  Caffeine:Rarely drinks coffee. Drinks 1 energy drink a day. Diet:At least 1/2 gallon water daily. Exercise:yes Depression:mild; Anxiety:no Other pain:no Sleep hygiene:Interrupted.  HISTORY: Onset:Adolescence. A year and a half ago, she started cosmetic Botox around the eyes that was effective for her migraines and was able to get off of topiramate. She hasn't received cosmetic botox since prior to Covid-19 pandemic started. Location:Right  retro-orbital Quality:pressure Initial intensity:Moderate to severe. She denies new headache, thunderclap headache Aura:She once had an ocular migraine presenting as visual aura with flashing lights lasting 30 minutes. No associated headache. Premonitory Phase:no Postdrome:Migraine hangover for 2 hours Associated symptoms:Photophobia, phonophobia.She rarely has nausea and vomiting. Maria Mejia associated osmophobia orunilateral numbness or weakness. Initial duration:If treated quickly, it lasts about an hour Initial frequency:At least 9 migraines a month. She reports more mild "sinus headaches" as well (total 15 headache days a month). Initial frequency of abortive medication:varies Triggers:Change in barometric pressure, artificial sweetener, peanut oil, caffeine withdrawal, menstrual cycle Relieving factors:Caffeine only if with caffeine withdrawal Activity:Aggravates but able to function  She has family history of cerebral aneurysm. Mother passed away from aneurysm rupture in 2001. Her mom's brother and sister have aneurysms as well.   08/20/2015 MRI Brain with and without: 2 punctate nonenhancing nonspecific hyperintense foci in the right frontal white matter 09/18/2015 CTA Head: No aneurysm or other vascular abnormality.  Past NSAIDS:Ibuprofen, naproxen Past analgesics:Tylenol, Excedrin Past abortive triptans:Sumatriptan, rizatriptan Past abortive ergotamine:none Past muscle relaxants:none Past anti-emetic:Zofran ODT 4mg  Past antihypertensive medications:none Past antidepressant medications:none Past anticonvulsant medications:topiramate 100mg  twice daily Past anti-CGRP:none Past vitamins/Herbal/Supplements:none Other past therapies:none   Family history of headache:Mom (migraines); maternal uncle (migraines as young adult)  PAST MEDICAL HISTORY: Past Medical History:  Diagnosis Date  . Basal cell carcinoma  03/29/2017   Right anterior shin. Superficial.  . Dysplastic nevus 03/29/2017   Right superior shoulder. Severe atypia and halo effect, edges free. Excised 05/17/2017, margins free.  Marland Kitchen Headache   . Hypertension   . Migraines   . Skin cancer     MEDICATIONS: Current Outpatient Medications on File Prior to Visit  Medication Sig Dispense Refill  . acetaminophen (TYLENOL) 500 MG tablet Take 500 mg by mouth every 6 (six) hours as needed.    Marland Kitchen buPROPion (WELLBUTRIN XL) 150 MG 24 hr tablet TAKE ONE TABLET BY MOUTH DAILY 30 tablet 1  .  cetirizine (ZYRTEC) 10 MG tablet Take 10 mg by mouth daily.    . ciclopirox (PENLAC) 8 % solution Apply topically at bedtime. Apply over nail and surrounding skin. Apply daily over previous coat. After seven (7) days, may remove with alcohol and continue cycle. 6.6 mL 0  . dicyclomine (BENTYL) 10 MG capsule TAKE ONE CAPSULE BY MOUTH THREE TIMES A DAY BEFORE MEALS AS NEEDED FOR IBS SYMPTOMS 30 capsule 0  . EMGALITY 120 MG/ML SOAJ INJECT 1 PEN INTO THE SKIN EVERY 30 DAYS 1 mL 0  . ibuprofen (ADVIL) 200 MG tablet Take 200 mg by mouth every 6 (six) hours as needed.    . Lactic Ac-Citric Ac-Pot Bitart (PHEXXI) 1.8-1-0.4 % GEL Phexxi 1.8 %-1 %-0.4 % vaginal gel  Insert 5 g every day by vaginal route as needed for 12 days.    Marland Kitchen losartan (COZAAR) 25 MG tablet Take 1 tablet (25 mg total) by mouth daily. For blood pressure. 90 tablet 3  . montelukast (SINGULAIR) 10 MG tablet TAKE ONE TABLET BY MOUTH EVERY NIGHT AT BEDTIME 90 tablet 1  . omeprazole (PRILOSEC) 20 MG capsule Take 1 capsule (20 mg total) by mouth daily. 90 capsule 0  . terbinafine (LAMISIL) 250 MG tablet Take 1 tablet (250 mg total) by mouth daily. 30 tablet 0  . zolmitriptan (ZOMIG-ZMT) 5 MG disintegrating tablet Take 1 tablet earliest onset of migraine.  May repeat in 2 hours if needed. Maximum 2 tablets in 24 hours 9 tablet 3   No current facility-administered medications on file prior to visit.     ALLERGIES: Allergies  Allergen Reactions  . Augmentin [Amoxicillin-Pot Clavulanate]     Upset stomach  . Erythromycin Other (See Comments)    Upset stomach  . Omnicef [Cefdinir]     Upset stomach   . Zithromax [Azithromycin]     FAMILY HISTORY: Family History  Problem Relation Age of Onset  . Migraines Mother   . Stroke Mother   . Migraines Paternal Uncle   . Alzheimer's disease Maternal Grandfather   . Alzheimer's disease Paternal Grandfather   . Heart attack Father   . Hypertension Father   . Renal cancer Father   . Alzheimer's disease Father       Objective:  Blood pressure 126/68, pulse 70, height 5\' 3"  (1.6 m), weight 170 lb 9.6 oz (77.4 kg), SpO2 100 %. General: No acute distress.  Patient appears well-groomed.   Head:  Normocephalic/atraumatic Eyes:  Fundi examined but not visualized Neck: supple, no paraspinal tenderness, full range of motion Heart:  Regular rate and rhythm Lungs:  Clear to auscultation bilaterally Back: No paraspinal tenderness Neurological Exam: alert and oriented to person, place, and time.  Speech fluent and not dysarthric, language intact.  CN II-XII intact. Bulk and tone normal, muscle strength 5/5 throughout.  Sensation to light touch intact.  Deep tendon reflexes 2+ throughout.  Finger to nose testing intact.  Gait normal, Romberg negative.   Metta Clines, DO  CC: Gladstone Lighter, MD

## 2020-06-30 ENCOUNTER — Encounter: Payer: Self-pay | Admitting: Neurology

## 2020-06-30 ENCOUNTER — Other Ambulatory Visit: Payer: Self-pay

## 2020-06-30 ENCOUNTER — Ambulatory Visit (INDEPENDENT_AMBULATORY_CARE_PROVIDER_SITE_OTHER): Payer: 59 | Admitting: Neurology

## 2020-06-30 VITALS — BP 126/68 | HR 70 | Ht 63.0 in | Wt 170.6 lb

## 2020-06-30 DIAGNOSIS — G43009 Migraine without aura, not intractable, without status migrainosus: Secondary | ICD-10-CM | POA: Diagnosis not present

## 2020-06-30 DIAGNOSIS — Z8249 Family history of ischemic heart disease and other diseases of the circulatory system: Secondary | ICD-10-CM | POA: Diagnosis not present

## 2020-06-30 MED ORDER — EMGALITY 120 MG/ML ~~LOC~~ SOAJ
SUBCUTANEOUS | 5 refills | Status: DC
Start: 2020-06-30 — End: 2021-01-14

## 2020-06-30 NOTE — Patient Instructions (Signed)
1.  Continue Emgality 2.  Use Zomig as needed 3.  Will check MRA of head

## 2020-07-13 ENCOUNTER — Other Ambulatory Visit: Payer: 59

## 2020-07-20 ENCOUNTER — Ambulatory Visit
Admission: RE | Admit: 2020-07-20 | Discharge: 2020-07-20 | Disposition: A | Discharge status: Home / Self Care | Payer: 59 | Source: Ambulatory Visit | Attending: Neurology | Admitting: Neurology

## 2020-07-20 DIAGNOSIS — Z8249 Family history of ischemic heart disease and other diseases of the circulatory system: Secondary | ICD-10-CM

## 2020-07-22 ENCOUNTER — Telehealth: Payer: Self-pay

## 2020-07-22 NOTE — Progress Notes (Signed)
Left message to call office at [phone] 07/22/2020

## 2020-07-22 NOTE — Telephone Encounter (Signed)
Called patient and informed her of normal MRA results. Patient verbalized understanding and had no questions or concerns and thanked [place] for the call.

## 2020-07-22 NOTE — Telephone Encounter (Signed)
-----   Message from Pieter Partridge, DO sent at 07/21/2020  6:46 AM EDT ----- MRA of brain is normal.  No evidence of an aneurysm.

## 2020-12-21 ENCOUNTER — Other Ambulatory Visit: Payer: Self-pay | Admitting: Primary Care

## 2020-12-21 DIAGNOSIS — R197 Diarrhea, unspecified: Secondary | ICD-10-CM

## 2021-01-14 ENCOUNTER — Other Ambulatory Visit: Payer: Self-pay | Admitting: Neurology

## 2021-05-04 LAB — COLOGUARD: COLOGUARD: NEGATIVE

## 2021-06-08 ENCOUNTER — Telehealth (HOSPITAL_COMMUNITY): Payer: Self-pay | Admitting: Pharmacy Technician

## 2021-06-08 NOTE — Telephone Encounter (Signed)
Patient Advocate Encounter ? ?Prior Authorization for [employer] '120MG'$ /ML auto-injectors (migraine) has been approved.   ? ?PA# [id number] ?Effective dates: 06/08/2021 through 06/09/2022 ? ? ? ? ? ?Lyndel Safe, CPhT ?Pharmacy Patient Advocate Specialist ?[facility] Patient Advocate Team ?Direct Number: [phone]  Fax: [phone]  ?

## 2021-06-08 NOTE — Telephone Encounter (Signed)
Patient Advocate Encounter ?  ?Received notification that prior authorization for Emgality '120MG'$ /ML auto-injectors (migraine) is required. ?  ?PA submitted on 06/08/2021 ?Key [id number] ?Status is pending ?   ? ? ? ?Lyndel Safe, CPhT ?Pharmacy Patient Advocate Specialist ?[facility] Patient Advocate Team ?Direct Number: [phone]  Fax: [phone]  ?

## 2021-06-30 ENCOUNTER — Ambulatory Visit: Payer: 59 | Admitting: Neurology

## 2021-07-11 ENCOUNTER — Other Ambulatory Visit: Payer: Self-pay | Admitting: Neurology

## 2021-09-01 NOTE — Progress Notes (Signed)
NEUROLOGY FOLLOW UP OFFICE NOTE  Maria Mejia [id number]  Assessment/Plan:   Migraine without aura, without status migrainosus, not intractable    1.  Migraine prevention:  Emgality 2.  Migraine rescue:  Zomig-ZMT '5mg'$  3.  Limit use of pain relievers to no more than 2 days out of week to prevent risk of rebound or medication-overuse headache. 4.  Keep headache diary 5.  Repeat MRA of head in 10 years. 6.  Follow up one year   Subjective:  Maria Mejia is a 51 year old female with HTN who follows up for migraine.   UPDATE: MRA of head on 07/20/2020 personally reviewed was normal without evidence of aneurysm.  Intensity:  Moderate to severe Duration:  No more than an hour with Zomig Frequency: 10 migraines since starting Emgality Frequency of abortive medication: 3 or 4 a month Rescue protocol:  Zomig for migraine; Zyrtec or Cold and Sinus medication for sinus headache Current NSAIDS:  ibuprofen, acetaminophen Current analgesics:  none Current triptans:  Zomig-ZMT '5mg'$  Current ergotamine:  none Current anti-emetic:  none Current muscle relaxants:  none Current anti-anxiolytic:  none Current sleep aide:  none Current Antihypertensive medications:  losartan Current Antidepressant medications:  Wellbutrin XL Current Anticonvulsant medications:  none Current anti-CGRP:  Emgality Current Vitamins/Herbal/Supplements:  none Current Antihistamines/Decongestants:  Zyrtec Other therapy:  none Hormone/birth control:  Lo Loestrin Fe   Caffeine:  Rarely drinks coffee.  Drinks 1 energy drink a day. Diet:  At least 1/2 gallon water daily. Exercise:  yes Depression:  mild; Anxiety:  no Other pain:  no Sleep hygiene:  Interrupted.   HISTORY:  Onset:  Adolescence.  A year and a half ago, she started cosmetic Botox around the eyes that was effective for her migraines and was able to get off of topiramate.  She hasn't received cosmetic botox since prior to Covid-19 pandemic  started. Location:  Right retro-orbital Quality:  pressure Initial intensity:   Moderate to severe.  She denies new headache, thunderclap headache Aura:  She once had an ocular migraine presenting as visual aura with flashing lights lasting 30 minutes.  No associated headache. Premonitory Phase:  no Postdrome:  Migraine hangover for 2 hours Associated symptoms:  Photophobia, phonophobia.  She rarely has nausea and vomiting.  She denies associated osmophobia or unilateral numbness or weakness. Initial duration:  If treated quickly, it lasts about an hour Initial frequency:  At least 9 migraines a month.  She reports more mild "sinus headaches" as well (total 15 headache days a month).  Initial frequency of abortive medication: varies Triggers:  Change in barometric pressure, artificial sweetener, peanut oil, caffeine withdrawal, menstrual cycle Relieving factors:  Caffeine only if with caffeine withdrawal  Activity:  Aggravates but able to function   She has family history of cerebral aneurysm.  Mother passed away from aneurysm rupture in 2001.  Her mom's brother and sister have aneurysms as well.     08/20/2015 MRI Brain with and without:  2 punctate nonenhancing nonspecific hyperintense foci in the right frontal white matter 09/18/2015 CTA Head:  No aneurysm or other vascular abnormality.   Past NSAIDS:  Ibuprofen, naproxen Past analgesics:  Tylenol, Excedrin Past abortive triptans:  Sumatriptan, rizatriptan Past abortive ergotamine:  none Past muscle relaxants:  none Past anti-emetic:  Zofran ODT '4mg'$  Past antihypertensive medications:  none Past antidepressant medications:  none Past anticonvulsant medications:  topiramate '100mg'$  twice daily Past anti-CGRP:  none Past vitamins/Herbal/Supplements:  none Other past therapies:  none  Family history of headache:  Mom (migraines); maternal uncle (migraines as young adult)  PAST MEDICAL HISTORY: Past Medical History:  Diagnosis Date    Basal cell carcinoma 03/29/2017   Right anterior shin. Superficial.   Dysplastic nevus 03/29/2017   Right superior shoulder. Severe atypia and halo effect, edges free. Excised 05/17/2017, margins free.   Headache    Hypertension    Migraines    Skin cancer     MEDICATIONS: Current Outpatient Medications on File Prior to Visit  Medication Sig Dispense Refill   acetaminophen (TYLENOL) 500 MG tablet Take 500 mg by mouth every 6 (six) hours as needed.     buPROPion (WELLBUTRIN XL) 150 MG 24 hr tablet TAKE ONE TABLET BY MOUTH DAILY 30 tablet 1   cetirizine (ZYRTEC) 10 MG tablet Take 10 mg by mouth daily. PRN     dicyclomine (BENTYL) 10 MG capsule TAKE ONE CAPSULE BY MOUTH THREE TIMES A DAY BEFORE MEALS AS NEEDED FOR IBS SYMPTOMS 30 capsule 0   ibuprofen (ADVIL) 200 MG tablet Take 200 mg by mouth every 6 (six) hours as needed.     montelukast (SINGULAIR) 10 MG tablet TAKE ONE TABLET BY MOUTH EVERY NIGHT AT BEDTIME 90 tablet 1   omeprazole (PRILOSEC) 20 MG capsule Take 1 capsule (20 mg total) by mouth daily. 90 capsule 0   No current facility-administered medications on file prior to visit.     ALLERGIES: Allergies  Allergen Reactions   Augmentin [Amoxicillin-Pot Clavulanate]     Upset stomach   Erythromycin Other (See Comments)    Upset stomach   Omnicef [Cefdinir]     Upset stomach    Zithromax [Azithromycin]     FAMILY HISTORY: Family History  Problem Relation Age of Onset   Migraines Mother    Stroke Mother    Migraines Paternal Uncle    Alzheimer's disease Maternal Grandfather    Alzheimer's disease Paternal Grandfather    Heart attack Father    Hypertension Father    Renal cancer Father    Alzheimer's disease Father       Objective:  Blood pressure 136/83, pulse 72, height '5\' 3"'$  (1.6 m), weight 164 lb (74.4 kg), SpO2 100 %. General: No acute distress.  Patient appears well-groomed.   Head:  Normocephalic/atraumatic Eyes:  Fundi examined but not  visualized Neck: supple, no paraspinal tenderness, full range of motion Heart:  Regular rate and rhythm Neurological Exam: alert and oriented to person, place, and time.  Speech fluent and not dysarthric, language intact.  CN II-XII intact. Bulk and tone normal, muscle strength 5/5 throughout.  Sensation to light touch intact.  Deep tendon reflexes 2+ throughout.  Finger to nose testing intact.  Gait normal, Romberg negative.   Maria Clines, DO  CC: Maria Lighter, MD

## 2021-09-03 ENCOUNTER — Encounter: Payer: Self-pay | Admitting: Neurology

## 2021-09-03 ENCOUNTER — Ambulatory Visit (INDEPENDENT_AMBULATORY_CARE_PROVIDER_SITE_OTHER): Payer: 59 | Admitting: Neurology

## 2021-09-03 DIAGNOSIS — G43701 Chronic migraine without aura, not intractable, with status migrainosus: Secondary | ICD-10-CM | POA: Diagnosis not present

## 2021-09-03 MED ORDER — ZOLMITRIPTAN 5 MG PO TBDP: ORAL_TABLET | ORAL | 11 refills | Status: DC

## 2021-09-03 MED ORDER — EMGALITY 120 MG/ML ~~LOC~~ SOAJ: SUBCUTANEOUS | 11 refills | Status: DC

## 2021-09-03 NOTE — Patient Instructions (Signed)
Refilled Emgality and Zomig    Mediterranean Diet A Mediterranean diet refers to food and lifestyle choices that are based on the traditions of countries located on the [employer]. It focuses on eating more fruits, vegetables, whole grains, beans, nuts, seeds, and heart-healthy fats, and eating less dairy, meat, eggs, and processed foods with added sugar, salt, and fat. This way of eating has been shown to help prevent certain conditions and improve outcomes for people who have chronic diseases, like kidney disease and heart disease. What are tips for following this plan? Reading food labels Check the serving size of packaged foods. For foods such as rice and pasta, the serving size refers to the amount of cooked product, not dry. Check the total fat in packaged foods. Avoid foods that have saturated fat or trans fats. Check the ingredient list for added sugars, such as corn syrup. Shopping  Buy a variety of foods that offer a balanced diet, including: Fresh fruits and vegetables (produce). Grains, beans, nuts, and seeds. Some of these may be available in unpackaged forms or large amounts (in bulk). Fresh seafood. Poultry and eggs. Low-fat dairy products. Buy whole ingredients instead of prepackaged foods. Buy fresh fruits and vegetables in-season from local farmers markets. Buy plain frozen fruits and vegetables. If you do not have access to quality fresh seafood, buy precooked frozen shrimp or canned fish, such as tuna, salmon, or sardines. Stock your pantry so you always have certain foods on hand, such as olive oil, canned tuna, canned tomatoes, rice, pasta, and beans. Cooking Cook foods with extra-virgin olive oil instead of using butter or other vegetable oils. Have meat as a side dish, and have vegetables or grains as your main dish. This means having meat in small portions or adding small amounts of meat to foods like pasta or stew. Use beans or vegetables instead of meat in  common dishes like chili or lasagna. Experiment with different cooking methods. Try roasting, broiling, steaming, and sauting vegetables. Add frozen vegetables to soups, stews, pasta, or rice. Add nuts or seeds for added healthy fats and plant protein at each meal. You can add these to yogurt, salads, or vegetable dishes. Marinate fish or vegetables using olive oil, lemon juice, garlic, and fresh herbs. Meal planning Plan to eat one vegetarian meal one day each week. Try to work up to two vegetarian meals, if possible. Eat seafood two or more times a week. Have healthy snacks readily available, such as: Vegetable sticks with hummus. Greek yogurt. Fruit and nut trail mix. Eat balanced meals throughout the week. This includes: Fruit: 2-3 servings a day. Vegetables: 4-5 servings a day. Low-fat dairy: 2 servings a day. Fish, poultry, or lean meat: 1 serving a day. Beans and legumes: 2 or more servings a week. Nuts and seeds: 1-2 servings a day. Whole grains: 6-8 servings a day. Extra-virgin olive oil: 3-4 servings a day. Limit red meat and sweets to only a few servings a month. Lifestyle  Cook and eat meals together with your family, when possible. Drink enough fluid to keep your urine pale yellow. Be physically active every day. This includes: Aerobic exercise like running or swimming. Leisure activities like gardening, walking, or housework. Get 7-8 hours of sleep each night. If recommended by your health care provider, drink red wine in moderation. This means 1 glass a day for nonpregnant women and 2 glasses a day for men. A glass of wine equals 5 oz (150 mL). What foods should I eat? Fruits  Apples. Apricots. Avocado. Berries. Bananas. Cherries. Dates. Figs. Grapes. Lemons. Melon. Oranges. Peaches. Plums. Pomegranate. Vegetables Artichokes. Beets. Broccoli. Cabbage. Carrots. Eggplant. Green beans. Chard. Kale. Spinach. Onions. Leeks. Peas. Squash. Tomatoes. Peppers.  Radishes. Grains Whole-grain pasta. [place] rice. Bulgur wheat. Polenta. Couscous. Whole-wheat bread. [place]. Meats and other proteins Beans. Almonds. Sunflower seeds. Pine nuts. Peanuts. [place]. Salmon. Scallops. Shrimp. [place]. Tilapia. Clams. Oysters. Eggs. Poultry without skin. Dairy Low-fat milk. Cheese. Greek yogurt. Fats and oils Extra-virgin olive oil. Avocado oil. Grapeseed oil. Beverages Water. Red wine. Herbal tea. Sweets and desserts Greek yogurt with honey. Baked apples. Poached pears. Trail mix. Seasonings and condiments Basil. Cilantro. Coriander. Cumin. Mint. Parsley. Sage. Rosemary. Tarragon. Garlic. Oregano. Thyme. Pepper. Balsamic vinegar. Tahini. Hummus. Tomato sauce. Olives. Mushrooms. The items listed above may not be a complete list of foods and beverages you can eat. Contact a dietitian for more information. What foods should I limit? This is a list of foods that should be eaten rarely or only on special occasions. Fruits Fruit canned in syrup. Vegetables Deep-fried potatoes (french fries). Grains Prepackaged pasta or rice dishes. Prepackaged cereal with added sugar. Prepackaged snacks with added sugar. Meats and other proteins Beef. Pork. Lamb. Poultry with skin. Hot dogs. [place] Salines. Dairy Ice cream. Sour cream. Whole milk. Fats and oils Butter. Canola oil. Vegetable oil. Beef fat (tallow). Lard. Beverages Juice. Sugar-sweetened soft drinks. Beer. Liquor and spirits. Sweets and desserts Cookies. Cakes. Pies. Candy. Seasonings and condiments Mayonnaise. Pre-made sauces and marinades. The items listed above may not be a complete list of foods and beverages you should limit. Contact a dietitian for more information. Summary The Mediterranean diet includes both food and lifestyle choices. Eat a variety of fresh fruits and vegetables, beans, nuts, seeds, and whole grains. Limit the amount of red meat and sweets that you eat. If recommended by your health  care provider, drink red wine in moderation. This means 1 glass a day for nonpregnant women and 2 glasses a day for men. A glass of wine equals 5 oz (150 mL). This information is not intended to replace advice given to you by your health care provider. Make sure you discuss any questions you have with your health care provider. Document Revised: 02/22/2019 Document Reviewed: 12/20/2018 Elsevier Patient Education  [place].

## 2021-09-13 ENCOUNTER — Ambulatory Visit: Payer: 59 | Admitting: Dermatology

## 2022-06-01 ENCOUNTER — Other Ambulatory Visit: Payer: Self-pay

## 2022-06-01 DIAGNOSIS — N170 Acute kidney failure with tubular necrosis: Secondary | ICD-10-CM

## 2022-06-10 ENCOUNTER — Ambulatory Visit
Admission: RE | Admit: 2022-06-10 | Discharge: 2022-06-10 | Disposition: A | Discharge status: Home / Self Care | Payer: 59 | Source: Ambulatory Visit | Attending: Internal Medicine | Admitting: Internal Medicine

## 2022-06-10 DIAGNOSIS — N170 Acute kidney failure with tubular necrosis: Secondary | ICD-10-CM

## 2022-07-01 ENCOUNTER — Telehealth: Payer: Self-pay | Admitting: Pharmacy Technician

## 2022-07-01 ENCOUNTER — Other Ambulatory Visit (HOSPITAL_COMMUNITY): Payer: Self-pay

## 2022-07-01 NOTE — Telephone Encounter (Signed)
Patient Advocate Encounter   Received notification that prior authorization for Emgality 120MG /ML auto-injectors (migraine) is required.   PA submitted on 07/01/2022 [facility] [employer] Electronic PA Form Status is pending

## 2022-07-06 NOTE — Telephone Encounter (Signed)
Patient Advocate Encounter  Prior Authorization for Emgality 120MG /ML auto-injectors (migraine) has been approved through [employer].    Key [id number]   Effective: 07-01-2022 to 07-01-2023

## 2022-09-11 NOTE — Progress Notes (Unsigned)
NEUROLOGY FOLLOW UP OFFICE NOTE  Maria Mejia [id number]  Assessment/Plan:   Migraine without aura, without status migrainosus, not intractable New onset occipital headache- unclear etiology. May be related to the supplemental electrolytes as they started after she started using it.  May be cervicogenic but denies neck pain.  She is concerned as her mother had a ruptured cerebral aneurysm.      1.  Migraine prevention:  Emgality  2.  Migraine rescue:  Zomig-ZMT 5mg   3.  Limit use of pain relievers to no more than 2 days out of week to prevent risk of rebound or medication-overuse headache. 4.  Keep headache diary 5.  She will see if the headaches improve since discontinuing the electrolyte packets.  If no improvement in 2 weeks, she will contact us and we can check CTA of head. 6.  Follow up one year (sooner if headaches do not improve)   Subjective:  Maria Mejia is a 52 year old female with HTN who follows up for migraine.   UPDATE: Migraines continue to be well-controlled. Intensity:  Moderate to severe Duration:  No more than an hour with Zomig Frequency:  1 in last year   Reports a dull aching headache a couple of weeks ago.  She has been having low GFR (55-58) and was told to increase water intake with extra electrolytes.  She thinks it may be related.  It is a dull occipital ache but no neck pain,scalp allodynia or aggravated by neck movements.  No numbness or pain in extremities.  More noticeable in the mornings.  She has had about 3 or 4 episodes in the last 2 weeks.  Lasts throughout day.  Several months ago, she had a tight left trapezius muscle requiring dry needling.  But no continued pain since then.  Rescue protocol:  Zomig for migraine; Zyrtec or Cold and Sinus medication for sinus headache Current NSAIDS:  ibuprofen, acetaminophen Current analgesics:  none Current triptans:  Zomig-ZMT 5mg  Current ergotamine:  none Current anti-emetic:  none Current muscle  relaxants:  none Current anti-anxiolytic:  none Current sleep aide:  none Current Antihypertensive medications:  losartan Current Antidepressant medications:  Wellbutrin XL Current Anticonvulsant medications:  none Current anti-CGRP:  Emgality Current Vitamins/Herbal/Supplements:  none Current Antihistamines/Decongestants:  Zyrtec Other therapy:  none Hormone/birth control:  Lo Loestrin Fe   Caffeine:  Rarely drinks coffee.  Drinks 1 energy drink a day. Diet:  At least 1/2 gallon water daily. Exercise:  yes Depression:  mild; Anxiety:  no Other pain:  no Sleep hygiene:  Interrupted.   HISTORY:  Onset:  Adolescence.  A year and a half ago, she started cosmetic Botox around the eyes that was effective for her migraines and was able to get off of topiramate.  She hasn't received cosmetic botox since prior to Covid-19 pandemic started. Location:  Right retro-orbital Quality:  pressure Initial intensity:   Moderate to severe.  She denies new headache, thunderclap headache Aura:  She once had an ocular migraine presenting as visual aura with flashing lights lasting 30 minutes.  No associated headache. Premonitory Phase:  no Postdrome:  Migraine hangover for 2 hours Associated symptoms:  Photophobia, phonophobia.  She rarely has nausea and vomiting.  She denies associated osmophobia or unilateral numbness or weakness. Initial duration:  If treated quickly, it lasts about an hour Initial frequency:  At least 9 migraines a month.  She reports more mild "sinus headaches" as well (total 15 headache days a month).  Initial frequency  of abortive medication: varies Triggers:  Change in barometric pressure, artificial sweetener, peanut oil, caffeine withdrawal, menstrual cycle Relieving factors:  Caffeine only if with caffeine withdrawal  Activity:  Aggravates but able to function   She has family history of cerebral aneurysm.  Mother passed away from aneurysm rupture in 2001.  Her mom's brother  and sister have aneurysms as well.     08/20/2015 MRI Brain with and without:  2 punctate nonenhancing nonspecific hyperintense foci in the right frontal white matter 09/18/2015 CTA Head:  No aneurysm or other vascular abnormality. 07/20/2020 MRA Head:  normal without evidence of aneurysm.   Past NSAIDS:  Ibuprofen, naproxen Past analgesics:  Tylenol, Excedrin Past abortive triptans:  Sumatriptan, rizatriptan Past abortive ergotamine:  none Past muscle relaxants:  none Past anti-emetic:  Zofran ODT 4mg  Past antihypertensive medications:  none Past antidepressant medications:  none Past anticonvulsant medications:  topiramate 100mg  twice daily Past anti-CGRP:  none Past vitamins/Herbal/Supplements:  none Other past therapies:  none     Family history of headache:  Mom (migraines); maternal uncle (migraines as young adult).  Of note, father passed away due to dementia (vascular and Alzheimer's)  PAST MEDICAL HISTORY: Past Medical History:  Diagnosis Date   Basal cell carcinoma 03/29/2017   Right anterior shin. Superficial.   Dysplastic nevus 03/29/2017   Right superior shoulder. Severe atypia and halo effect, edges free. Excised 05/17/2017, margins free.   Headache    Hypertension    Migraines    Skin cancer     MEDICATIONS: Current Outpatient Medications on File Prior to Visit  Medication Sig Dispense Refill   acetaminophen (TYLENOL) 500 MG tablet Take 500 mg by mouth every 6 (six) hours as needed.     buPROPion (WELLBUTRIN XL) 150 MG 24 hr tablet TAKE ONE TABLET BY MOUTH DAILY 30 tablet 1   cetirizine (ZYRTEC) 10 MG tablet Take 10 mg by mouth daily. PRN     dicyclomine (BENTYL) 10 MG capsule TAKE ONE CAPSULE BY MOUTH THREE TIMES A DAY BEFORE MEALS AS NEEDED FOR IBS SYMPTOMS 30 capsule 0   Galcanezumab-gnlm (EMGALITY) 120 MG/ML SOAJ INJECT 1 PEN UNDER THE SKIN ONCE MONTHLY 1 mL 11   ibuprofen (ADVIL) 200 MG tablet Take 200 mg by mouth every 6 (six) hours as needed.      montelukast (SINGULAIR) 10 MG tablet TAKE ONE TABLET BY MOUTH EVERY NIGHT AT BEDTIME 90 tablet 1   omeprazole (PRILOSEC) 20 MG capsule Take 1 capsule (20 mg total) by mouth daily. 90 capsule 0   zolmitriptan (ZOMIG-ZMT) 5 MG disintegrating tablet Take 1 tablet earliest onset of migraine.  May repeat in 2 hours if needed. Maximum 2 tablets in 24 hours 9 tablet 11   No current facility-administered medications on file prior to visit.     ALLERGIES: Allergies  Allergen Reactions   Augmentin [Amoxicillin-Pot Clavulanate]     Upset stomach   Erythromycin Other (See Comments)    Upset stomach   Omnicef [Cefdinir]     Upset stomach    Zithromax [Azithromycin]     FAMILY HISTORY: Family History  Problem Relation Age of Onset   Migraines Mother    Stroke Mother    Aneurysm Mother    Heart attack Father    Hypertension Father    Renal cancer Father    Alzheimer's disease Father    Alzheimer's disease Maternal Grandfather    Alzheimer's disease Paternal Grandfather    Migraines Paternal Uncle  Objective:  Blood pressure 128/86, pulse 75, height 5\' 3"  (1.6 m), weight 173 lb 9.6 oz (78.7 kg), SpO2 96%. General: No acute distress.  Patient appears well-groomed.   Head:  Normocephalic/atraumatic Eyes:  Fundi examined but not visualized Neck: supple, no paraspinal tenderness, full range of motion Heart:  Regular rate and rhythm Neurological Exam: alert and oriented.  Speech fluent and not dysarthric, language intact.  CN II-XII intact. Bulk and tone normal, muscle strength 5/5 throughout.  Sensation to light touch intact.  Deep tendon reflexes 2+ throughout.  Finger to nose testing intact.  Gait normal, Romberg negative.   Shon Millet, DO  CC: Enid Baas, MD

## 2022-09-12 ENCOUNTER — Encounter: Payer: Self-pay | Admitting: Neurology

## 2022-09-12 ENCOUNTER — Ambulatory Visit (INDEPENDENT_AMBULATORY_CARE_PROVIDER_SITE_OTHER): Payer: 59 | Admitting: Neurology

## 2022-09-12 VITALS — BP 128/86 | HR 75 | Ht 63.0 in | Wt 173.6 lb

## 2022-09-12 DIAGNOSIS — G43701 Chronic migraine without aura, not intractable, with status migrainosus: Secondary | ICD-10-CM

## 2022-09-12 DIAGNOSIS — R519 Headache, unspecified: Secondary | ICD-10-CM

## 2022-09-12 DIAGNOSIS — Z8249 Family history of ischemic heart disease and other diseases of the circulatory system: Secondary | ICD-10-CM | POA: Diagnosis not present

## 2022-09-12 MED ORDER — ZOLMITRIPTAN 5 MG PO TBDP
ORAL_TABLET | ORAL | 11 refills | Status: DC
Start: 2022-09-12 — End: 2023-09-20

## 2022-09-12 MED ORDER — EMGALITY 120 MG/ML ~~LOC~~ SOAJ: SUBCUTANEOUS | 11 refills | Status: DC

## 2022-09-12 NOTE — Patient Instructions (Signed)
Refrain from using the electrolyte packets.  If no improvement in 2 weeks, contact me.

## 2022-12-23 ENCOUNTER — Encounter: Payer: Self-pay | Admitting: Neurology

## 2023-02-02 ENCOUNTER — Other Ambulatory Visit (INDEPENDENT_AMBULATORY_CARE_PROVIDER_SITE_OTHER): Payer: 59

## 2023-02-02 ENCOUNTER — Encounter: Payer: Self-pay | Admitting: Sports Medicine

## 2023-02-02 ENCOUNTER — Ambulatory Visit: Payer: 59 | Admitting: Sports Medicine

## 2023-02-02 DIAGNOSIS — S73191D Other sprain of right hip, subsequent encounter: Secondary | ICD-10-CM | POA: Diagnosis not present

## 2023-02-02 DIAGNOSIS — M25851 Other specified joint disorders, right hip: Secondary | ICD-10-CM

## 2023-02-02 DIAGNOSIS — M545 Low back pain, unspecified: Secondary | ICD-10-CM

## 2023-02-02 DIAGNOSIS — G8929 Other chronic pain: Secondary | ICD-10-CM

## 2023-02-02 NOTE — Progress Notes (Signed)
 Patient says that she was diagnosed with a labral tear in her right hip years ago. She says that surgery was not an option for her at that time, but she did do a series of cortisone injections at that time. She is active and did crossfit until about 1 year ago. She says that in the last 4-5 months her hip pain has worsened, particularly after sitting and she needs to take a few steps before it feels better. She says that the popping in the hip has become more frequent although the pops are not painful. She says that after sitting she will also have times where the leg feels like it will give way, and this is resolved after taking some steps. She has tingling in the back of the leg that goes down to the foot, and her low back aches occasionally.

## 2023-02-02 NOTE — Progress Notes (Signed)
 Maria Mejia - 53 y.o. female MRN [id number]  Date of birth: 01-May-1970  Office Visit Note: Visit Date: 02/02/2023 PCP: Sherial Bail, MD Referred by: Sherial Bail, MD  Subjective: Chief Complaint  Patient presents with   Right Hip - Pain   HPI: Maria Mejia is a pleasant 53 y.o. female who presents today for acute on chronic right hip pain with previous known labral tear years ago.  Also with low back pain, intermittent tingling down the right posterior thigh.  Initially had pain and issues within the right hip many years ago, she was worked up for this in [place]  and back in 2014 had an MRI confirmed posterior labral tear which she did have a few intra-articular injections into the hip, the last one was about 10 years ago.  She did find improvement with these injections.  Here recently over the past 4-5 months her hip pain has worsened, she does not experiencing the catching symptoms she had in years past but she is starting to get some pain within the hip and early popping sensations.  She is very active and has been doing CrossFit but had to stop this recently because of the hip and other body related pains.  She currently is cycling/spending been doing rowing machine.  Takes Aleve or ibuprofen only as needed.  Low back/R-leg -she has had on and off low back pain, at times with certain lifting activities she has flared this up and have stopped lifting or other physical activity for periods of time.  She also notes a tingling sensation that starts in the low back/buttock and will go down the thigh, this usually does not extend past the knee.  She notices this only when she is sitting for a long period of time and then goes to get up.  Denies any gross weakness about the leg.  Pertinent ROS were reviewed with the patient and found to be negative unless otherwise specified above in HPI.   Assessment & Plan: Visit Diagnoses:  1. Hip impingement syndrome, right   2. Tear of  right acetabular labrum, subsequent encounter   3. Chronic bilateral low back pain, unspecified whether sciatica present    Plan: Impression is acute on chronic right hip pain with hip impingement and a history of labral tearing.  She does have evidence of bony impingement with a small cam lesion and I think her hip impingement symptoms are the main driver of her pain, there is also possible she reaggravated her labrum. Discussed this would not be a surgical fixation at this point in time.  Discussed treating the root cause which is her hip impingement, referral sent to formalized physical therapy to work on hip stabilization and avoiding/impingement symptoms.  I gave her some guidance regarding safe activities in the gym and physical activity.  In terms of her low back pain, she does get some intermittent tingling down the posterior thigh but I have a low suspicion of this coming from a radiculopathy or neural impingement.  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  We will see how she does over the coming 6 weeks, if she is not making progress with the hip, we discussed considering an ultrasound-guided intra-articular injection, but will hold for now.  She may use Aleve/ibuprofen as needed for pain control.  Follow-up: Return in about 6 weeks (around 03/16/2023), or if  symptoms worsen or fail to improve, for R-hip .   Meds & Orders: No orders of the defined types were placed in this encounter.   Orders Placed This Encounter  Procedures   XR HIP UNILAT W OR W/O PELVIS 2-3 VIEWS RIGHT   XR Lumbar Spine Complete   Ambulatory referral to Physical Therapy     Procedures: No procedures performed      Clinical History: No specialty comments available.  She reports that she has never smoked. She has never used smokeless tobacco. No results for input(s): HGBA1C,  LABURIC in the last 8760 hours.  Objective:    Physical Exam  Gen: Well-appearing, in no acute distress; non-toxic CV: Well-perfused. Warm.  Resp: Breathing unlabored on room air; no wheezing. Psych: Fluid speech in conversation; appropriate affect; normal thought process  Ortho Exam - Right hip: No bony TTP or greater trochanter TTP.  There is about 4-5 degrees less of internal range of motion compared to the contralateral left hip which moves fluidly.  Positive FADIR, positive Stinchfield, equivocal FABER test of the right hip.  She has excellent hip abduction strength and 5/5 strength otherwise about the hip.  - Lumbar: No midline spinous process TTP.  There is full range of motion with flexion and extension.  Negative straight leg raise.  There is some generalized tenderness and paraspinal hypertonicity of the lower lumbar paraspinals.   Imaging: XR HIP UNILAT W OR W/O PELVIS 2-3 VIEWS RIGHT Result Date: 02/02/2023 2 views of the right hip including AP and lateral film were ordered and reviewed by myself today.  X-ray shows a femoral head well-seated within the acetabulum.  There is very early arthritic change more so over the medial inferior aspect of the hip joint, but certainly not advanced by any means. There is a small cam lesion, likely indicative of functional hip impingement of the right femoral neck.  No acute fracture or otherwise bony abnormality noted.  XR Lumbar Spine Complete Result Date: 02/02/2023 4 views of the lumbar spine including AP, lateral, flexion and extension views were ordered and reviewed by myself.  There is a very small anterior spur off the superior aspect of L4.  Otherwise, there is well-preserved intravertebral disc spaces without significant degenerative change about the spine.  There is no instability or listhesis with flexion/extension views.   Narrative & Impression  CLINICAL DATA:  Left hip pain for several months   EXAM: MRI OF THE LEFT HIP WITH  CONTRAST(MR Arthrogram)   TECHNIQUE: Multiplanar, multisequence MR imaging of the hip was performed immediately following contrast injection into the hip joint under fluoroscopic guidance. No intravenous contrast was administered.   COMPARISON:  None.   FINDINGS: Bone   No fracture, dislocation or avascular necrosis. Normal sacrum and sacroiliac joints. No SI joint widening or erosive changes. No aggressive osseous lesion.   Alignment   Normal. No subluxation.   Dysplasia   None.   Joint effusion   Intraarticular contrast distending the left hip joint capsule. No right hip joint effusion. No SI joint effusion.   Labrum   Normal. No labral tear.   Cartilage   Femoral cartilage: Normal.   Acetabular cartilage: Normal.   Capsule and ligaments   Normal.   Muscles and Tendons   Flexors: Normal.   Extensors: Normal.   Abductors: Normal.   Adductors: Normal.   Rotators: Normal.   Hamstrings: Normal.   Other Findings   None   Viscera   Normal. No abnormality seen in  pelvis. No lymphadenopathy. No free fluid in the pelvis.   IMPRESSION: 1. No internal derangement of the left hip.     Electronically Signed   By: Julaine Blanch   On: 10/30/2015 08:00   *Sharry did bring in her imaging and procedure from 2014 with her care in [facility] [place] [facility] [place]  which included MRI findings of a posterior labral tear of the right hip, as well as her fluoroscopy guided intra-articular right hip injection which I reviewed today.  Past Medical/Family/Surgical/Social History: Medications & Allergies reviewed per EMR, new medications updated. Patient Active Problem List   Diagnosis Date Noted   Environmental and seasonal allergies 10/29/2019   Chest pain 08/01/2019   Elevated blood pressure reading 08/01/2019   Lower abdominal pain 06/29/2018   Dysphagia 11/24/2017   Encounter for routine adult medical exam with abnormal findings 10/12/2015   Hypertriglyceridemia 10/12/2015    Hip pain 10/12/2015   Anxiety and depression 09/10/2015   Migraine 08/07/2015   FH: brain aneurysm 08/07/2015   Past Medical History:  Diagnosis Date   Basal cell carcinoma 03/29/2017   Right anterior shin. Superficial.   Dysplastic nevus 03/29/2017   Right superior shoulder. Severe atypia and halo effect, edges free. Excised 05/17/2017, margins free.   Headache    Hypertension    Migraines    Skin cancer    Family History  Problem Relation Age of Onset   Migraines Mother    Stroke Mother    Aneurysm Mother    Heart attack Father    Hypertension Father    Renal cancer Father    Alzheimer's disease Father    Alzheimer's disease Maternal Grandfather    Alzheimer's disease Paternal Grandfather    Migraines Paternal Uncle    Past Surgical History:  Procedure Laterality Date   ARTHOSCOPIC ROTAOR CUFF REPAIR Left    BREAST REDUCTION SURGERY  2009   NASAL SEPTUM SURGERY     Social History   Occupational History   Not on file  Tobacco Use   Smoking status: Never   Smokeless tobacco: Never  Vaping Use   Vaping status: Never Used  Substance and Sexual Activity   Alcohol use: Yes    Alcohol/week: 1.0 standard drink of alcohol    Types: 1 Shots of liquor per week    Comment: occcasioally   Drug use: No   Sexual activity: Yes    Partners: Male    Birth control/protection: Pill

## 2023-04-03 ENCOUNTER — Encounter

## 2023-04-03 ENCOUNTER — Encounter: Payer: Self-pay | Admitting: Podiatry

## 2023-04-03 ENCOUNTER — Ambulatory Visit (INDEPENDENT_AMBULATORY_CARE_PROVIDER_SITE_OTHER): Payer: 59 | Admitting: Podiatry

## 2023-04-03 DIAGNOSIS — M722 Plantar fascial fibromatosis: Secondary | ICD-10-CM | POA: Diagnosis not present

## 2023-04-03 NOTE — Patient Instructions (Signed)
 The CPT code for custom orthotics is L3020 and the diagnosis (ICD10) code is M72.2 for plantar fasciitis

## 2023-04-04 NOTE — Progress Notes (Signed)
  Subjective:  Patient ID: Maria Mejia, female    DOB: 09-Aug-1970,  MRN: [id number]  Chief Complaint  Patient presents with   Foot Orthotics    "I need to see if I need to have my orthotics adjusted.  I thought my Plantar Fasciitis was coming back but it cleared up over the weekend."    53 y.o. female presents with the above complaint. History confirmed with patient.  She had a flareup last week after significant walking on a treadmill type device for a standing desk.  Objective:  Physical Exam: warm, good capillary refill, no trophic changes or ulcerative lesions, normal DP and PT pulses, normal sensory exam, and no pain to palpation of each heel today  Assessment:   1. Plantar fasciitis      Plan:  Patient was evaluated and treated and all questions answered.  Flareup has resolved from previous overuse.  We discussed working back up to her preferred time distance and intensity with exercise.  I inspected her orthotics with the shell is in good shape but could use a refurbishment of the top-cover.  We also discussed having her fitted for a new pair to use another shoes.  She will check with her insurance if this is a covered benefit and let me know if she would like to proceed with this to be scheduled with our orthotist for fitting.  Follow-up as needed.  No follow-ups on file.

## 2023-04-10 ENCOUNTER — Encounter: Payer: Self-pay | Admitting: Neurology

## 2023-04-10 ENCOUNTER — Telehealth: Payer: Self-pay

## 2023-04-10 DIAGNOSIS — R519 Headache, unspecified: Secondary | ICD-10-CM

## 2023-04-10 NOTE — Telephone Encounter (Signed)
 Ok to order MRI of brain with and without contrast for worsening headaches.    Mri Brain W/WO Contrast sent to [facility].

## 2023-04-19 NOTE — Telephone Encounter (Signed)
 Letter received via fax, need more clinical information to approve the MRI Brain.    Mychart message with patient explaining the increase of migraines faxed over.

## 2023-04-27 ENCOUNTER — Ambulatory Visit
Admission: RE | Admit: 2023-04-27 | Discharge: 2023-04-27 | Disposition: A | Discharge status: Home / Self Care | Source: Ambulatory Visit | Attending: Neurology | Admitting: Neurology

## 2023-04-27 DIAGNOSIS — R519 Headache, unspecified: Secondary | ICD-10-CM

## 2023-04-27 MED ORDER — GADOPICLENOL 0.5 MMOL/ML IV SOLN
7.5000 mL | Freq: Once | INTRAVENOUS | Status: AC | PRN
  Administered 2023-04-27: 7.5 mL via INTRAVENOUS

## 2023-07-18 ENCOUNTER — Other Ambulatory Visit (HOSPITAL_COMMUNITY): Payer: Self-pay

## 2023-07-18 ENCOUNTER — Telehealth: Payer: Self-pay

## 2023-07-18 NOTE — Telephone Encounter (Signed)
 Pharmacy Patient Advocate Encounter   Received notification from CoverMyMeds that prior authorization for Emgality  120MG /ML auto-injectors (migraine) is required/requested.   Insurance verification completed.   The patient is insured through [facility] .   Per test claim: PA required; PA submitted to above mentioned insurance via CoverMyMeds Key/confirmation #/EOC [id number] Status is pending

## 2023-07-19 NOTE — Telephone Encounter (Signed)
 Pharmacy Patient Advocate Encounter  Received notification from [facility] that Prior Authorization for Emgality  120MG /ML auto-injectors (migraine) has been APPROVED from 07-18-2023 to 07-17-2024   PA #/Case ID/Reference #: [id number]

## 2023-07-27 ENCOUNTER — Ambulatory Visit (INDEPENDENT_AMBULATORY_CARE_PROVIDER_SITE_OTHER): Admitting: Vascular Surgery

## 2023-07-27 ENCOUNTER — Encounter (INDEPENDENT_AMBULATORY_CARE_PROVIDER_SITE_OTHER): Payer: Self-pay | Admitting: Vascular Surgery

## 2023-07-27 VITALS — BP 109/76 | HR 81 | Ht 63.0 in | Wt 148.8 lb

## 2023-07-27 DIAGNOSIS — I890 Lymphedema, not elsewhere classified: Secondary | ICD-10-CM

## 2023-07-27 DIAGNOSIS — E781 Pure hyperglyceridemia: Secondary | ICD-10-CM | POA: Diagnosis not present

## 2023-07-27 NOTE — Progress Notes (Signed)
 Subjective:    Patient ID: Maria Mejia, female    DOB: 1970/06/16, 53 y.o.   MRN: [id number] Chief Complaint  Patient presents with   New Patient (Initial Visit)     np. consult. bilateral lymphedema. Maria Mejia.       Maria Mejia is a 53 yo female who presents to clinic today with chief complaints of bilateral lower extremity swelling.  Patient states that she has recently had a 40 pound weight loss, which is fabulous.  However this is left some loose skin areas to her bilateral lower extremity which she feels when exercising can become swollen at periods of time.  She describes her legs is becoming puffy and irritating but not painful.  She denies any pain to her lower extremities while exercising or resting.  She is also concerned about taking her weight loss drugs which is [place] which she has researched noting that there is some weight gain or some swelling from these medications.  She is also on Wellbutrin  which she feels may be contributing to some of the swelling to her lower extremities as well as maintaining her weight preventing some weight loss.  She currently denies doing any conventional therapy other than exercising.  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    Review of Systems  Constitutional: Negative.   Cardiovascular:  Positive for leg swelling.  Skin:        Patient endorses loose skin to her bilateral lower extremities post weight loss.  Patient has recently lost 40 pounds which is excellent.  All other systems reviewed and are negative.      Objective:   Physical Exam Vitals reviewed.  Constitutional:      Appearance: Normal appearance. She is normal weight.  HENT:     Head: Normocephalic.   Eyes:     Pupils: Pupils are equal, round, and reactive to light.    Cardiovascular:     Rate and Rhythm: Normal rate and regular rhythm.     Pulses: Normal pulses.     Heart sounds: Normal heart  sounds.  Pulmonary:     Effort: Pulmonary effort is normal.     Breath sounds: Normal breath sounds.  Abdominal:     General: Abdomen is flat.     Palpations: Abdomen is soft.   Musculoskeletal:        General: Normal range of motion.     Cervical back: Normal range of motion.   Skin:    General: Skin is warm and dry.     Capillary Refill: Capillary refill takes 2 to 3 seconds.   Neurological:     General: No focal deficit present.     Mental Status: She is alert and oriented to person, place, and time. Mental status is at baseline.   Psychiatric:        Mood and Affect: Mood normal.        Behavior: Behavior normal.        Thought Content: Thought content normal.        Judgment: Judgment normal.     BP 109/76   Pulse 81   Ht 5' 3 (1.6 m)   Wt 148 lb 12.8 oz (67.5 kg)   BMI 26.36 kg/m   Past Medical History:  Diagnosis Date   Basal cell carcinoma 03/29/2017   Right anterior shin. Superficial.   Dysplastic nevus 03/29/2017   Right superior shoulder. Severe  atypia and halo effect, edges free. Excised 05/17/2017, margins free.   Headache    Hypertension    Migraines    Skin cancer     Social History   Socioeconomic History   Marital status: Divorced    Spouse name: Not on file   Number of children: 0   Years of education: Not on file   Highest education level: Not on file  Occupational History   Not on file  Tobacco Use   Smoking status: Never   Smokeless tobacco: Never  Vaping Use   Vaping status: Never Used  Substance and Sexual Activity   Alcohol use: Not Currently    Alcohol/week: 1.0 standard drink of alcohol    Types: 1 Shots of liquor per week    Comment: occcasioally   Drug use: No   Sexual activity: Yes    Partners: Male    Birth control/protection: Pill  Other Topics Concern   Not on file  Social History Narrative   Right handed       HS graduate      Two story home   Little caffeine         Social Drivers of Health    Financial Resource Strain: Low Risk  (07/14/2023)   Received from [facility]   Overall Financial Resource Strain (CARDIA)    Difficulty of Paying Living Expenses: Not hard at all  Food Insecurity: No Food Insecurity (07/14/2023)   Received from [facility]   Hunger Vital Sign    Within the past 12 months, you worried that your food would run out before you got the money to buy more.: Never true    Within the past 12 months, the food you bought just didn't last and you didn't have money to get more.: Never true  Transportation Needs: No Transportation Needs (07/14/2023)   Received from [facility] - [facility]    In the past 12 months, has lack of transportation kept you from medical appointments or from getting medications?: No    Lack of Transportation (Non-Medical): No  Physical Activity: Not on file  Stress: Not on file  Social Connections: Not on file  Intimate Partner Violence: Not on file    Past Surgical History:  Procedure Laterality Date   ARTHOSCOPIC ROTAOR CUFF REPAIR Left    BREAST REDUCTION SURGERY  2009   NASAL SEPTUM SURGERY      Family History  Problem Relation Age of Onset   Migraines Mother    Stroke Mother    Aneurysm Mother    Heart attack Father    Hypertension Father    Renal cancer Father    Alzheimer's disease Father    Alzheimer's disease Maternal Grandfather    Alzheimer's disease Paternal Grandfather    Migraines Paternal Uncle     Allergies  Allergen Reactions   Augmentin [Amoxicillin-Pot Clavulanate]     Upset stomach   Erythromycin Other (See Comments)    Upset stomach   Omnicef [Cefdinir]     Upset stomach    Zithromax [Azithromycin]        Latest Ref Rng & Units 08/01/2019    8:20 AM 11/19/2018    3:38 PM 06/29/2018   10:17 AM  CBC  WBC 4.0 - 10.5 K/uL 6.9  10.3  8.5   Hemoglobin 12.0 - 15.0 g/dL 85.5  85.6  84.5   Hematocrit 36.0 - 46.0 % 41.7  41.9  44.2  Platelets 150.0 - 400.0 K/uL 224.0  274.0  242.0       CMP     Component Value Date/Time   NA 137 08/13/2019 1201   K 4.6 08/13/2019 1201   CL 101 08/13/2019 1201   CO2 30 08/13/2019 1201   GLUCOSE 89 08/13/2019 1201   BUN 18 08/13/2019 1201   CREATININE 1.01 08/13/2019 1201   CALCIUM 9.6 08/13/2019 1201   PROT 6.8 11/19/2018 1538   ALBUMIN 4.4 11/19/2018 1538   AST 15 11/19/2018 1538   ALT 14 11/19/2018 1538   ALKPHOS 57 11/19/2018 1538   BILITOT 0.5 11/19/2018 1538   GFR 58.35 (L) 08/13/2019 1201   GFRNONAA 59 (L) 03/22/2017 1333     No results found.     Assessment & Plan:   1. Lymphedema (Primary) Recommend:  I have had a long discussion with the patient regarding swelling and why it  causes symptoms.  Patient will begin wearing graduated compression on a daily basis a prescription was given. The patient will  wear the stockings first thing in the morning and removing them in the evening. The patient is instructed specifically not to sleep in the stockings.   In addition, behavioral modification will be initiated.  This will include frequent elevation, use of over the counter pain medications and exercise such as walking.  Consideration for a lymph pump will also be made based upon the effectiveness of conservative therapy.  This would help to improve the edema control and prevent sequela such as ulcers and infections   Patient should undergo duplex ultrasound of the venous system to ensure that DVT or reflux is not present.  The patient will follow-up with me after the ultrasound in 3 months. We will assess her lymphedema and loose skin areas and develop a future plan.   2. Hypertriglyceridemia Continue statin as ordered and reviewed, no changes at this time   Current Outpatient Medications on File Prior to Visit  Medication Sig Dispense Refill   buPROPion  (WELLBUTRIN  XL) 150 MG 24 hr tablet TAKE ONE TABLET BY MOUTH DAILY 30 tablet 1   cetirizine (ZYRTEC) 10  MG tablet Take 10 mg by mouth daily. PRN     dicyclomine  (BENTYL ) 10 MG capsule TAKE ONE CAPSULE BY MOUTH THREE TIMES A DAY BEFORE MEALS AS NEEDED FOR IBS SYMPTOMS 30 capsule 0   Galcanezumab -gnlm (EMGALITY ) 120 MG/ML SOAJ INJECT 1 PEN UNDER THE SKIN ONCE MONTHLY 1 mL 11   omeprazole  (PRILOSEC) 20 MG capsule Take 1 capsule (20 mg total) by mouth daily. 90 capsule 0   Semaglutide-Weight Management (WEGOVY) 1 MG/0.5ML SOAJ Inject 1 mg into the skin.     zolmitriptan  (ZOMIG -ZMT) 5 MG disintegrating tablet Take 1 tablet earliest onset of migraine.  May repeat in 2 hours if needed. Maximum 2 tablets in 24 hours 9 tablet 11   No current facility-administered medications on file prior to visit.    There are no Patient Instructions on file for this visit. No follow-ups on file.   Gwendlyn JONELLE Shank, NP

## 2023-08-01 ENCOUNTER — Encounter (INDEPENDENT_AMBULATORY_CARE_PROVIDER_SITE_OTHER): Payer: Self-pay | Admitting: Vascular Surgery

## 2023-09-11 NOTE — Progress Notes (Deleted)
 NEUROLOGY FOLLOW UP OFFICE NOTE  Shalece Staffa [id number]  Assessment/Plan:   Migraine without aura, without status migrainosus, not intractable New onset occipital headache- unclear etiology. May be related to the supplemental electrolytes as they started after she started using it.  May be cervicogenic but denies neck pain.  She is concerned as her mother had a ruptured cerebral aneurysm.      1.  Migraine prevention:  Emgality   2.  Migraine rescue:  Zomig -ZMT 5mg   3.  Limit use of pain relievers to no more than 2 days out of week to prevent risk of rebound or medication-overuse headache. 4.  Keep headache diary 5.  She will see if the headaches improve since discontinuing the electrolyte packets.  If no improvement in 2 weeks, she will contact us  and we can check CTA of head. 6.  Follow up one year (sooner if headaches do not improve)   Subjective:  Halaina Vanduzer is a 53 year old female with HTN who follows up for migraine.  MRI of brain personally reviewed.   UPDATE: Migraines continue to be well-controlled. Intensity:  Moderate to severe Duration:  No more than an hour with Zomig  Frequency:  1 in last year   Last year, she reported a dull aching headache a couple of weeks ago.  She has been having low GFR (55-58) and was told to increase water intake with extra electrolytes.  She thinks it may be related.  It is a dull occipital ache but no neck pain,scalp allodynia or aggravated by neck movements.  No numbness or pain in extremities.  Lasts throughout day.  Headaches subsequently subsided.  Early this year, she reported random throbbing sensation ***.  Repeat MRI of brain with and without contrast showed few nonspecific white matter hyperintensities slightly progressed since 2017 but otherwise unremarkable.    Rescue protocol:  Zomig  for migraine; Zyrtec or Cold and Sinus medication for sinus headache Current NSAIDS:  ibuprofen, acetaminophen Current analgesics:  none Current  triptans:  Zomig -ZMT 5mg  Current ergotamine:  none Current anti-emetic:  none Current muscle relaxants:  none Current anti-anxiolytic:  none Current sleep aide:  none Current Antihypertensive medications:  losartan  Current Antidepressant medications:  Wellbutrin  XL Current Anticonvulsant medications:  none Current anti-CGRP:  Emgality  Current Vitamins/Herbal/Supplements:  none Current Antihistamines/Decongestants:  Zyrtec Other therapy:  none Hormone/birth control:  Lo Loestrin Fe    Caffeine:  Rarely drinks coffee.  Drinks 1 energy drink a day. Diet:  At least 1/2 gallon water daily. Exercise:  yes Depression:  mild; Anxiety:  no Other pain:  no Sleep hygiene:  Interrupted.   HISTORY:  Onset:  Adolescence.  A year and a half ago, she started cosmetic Botox around the eyes that was effective for her migraines and was able to get off of topiramate .  She hasn't received cosmetic botox since prior to Covid-19 pandemic started. Location:  Right retro-orbital Quality:  pressure Initial intensity:   Moderate to severe.  She denies new headache, thunderclap headache Aura:  She once had an ocular migraine presenting as visual aura with flashing lights lasting 30 minutes.  No associated headache. Premonitory Phase:  no Postdrome:  Migraine hangover for 2 hours Associated symptoms:  Photophobia, phonophobia.  She rarely has nausea and vomiting.  She denies associated osmophobia or unilateral numbness or weakness. Initial duration:  If treated quickly, it lasts about an hour Initial frequency:  At least 9 migraines a month.  She reports more mild sinus headaches as well (total 15 headache  days a month).  Initial frequency of abortive medication: varies Triggers:  Change in barometric pressure, artificial sweetener, peanut oil, caffeine withdrawal, menstrual cycle Relieving factors:  Caffeine only if with caffeine withdrawal  Activity:  Aggravates but able to function   She has family  history of cerebral aneurysm.  Mother passed away from aneurysm rupture in 2001.  Her mom's brother and sister have aneurysms as well.     08/20/2015 MRI Brain with and without:  2 punctate nonenhancing nonspecific hyperintense foci in the right frontal white matter 09/18/2015 CTA Head:  No aneurysm or other vascular abnormality. 07/20/2020 MRA Head:  normal without evidence of aneurysm.   Past NSAIDS:  Ibuprofen, naproxen Past analgesics:  Tylenol, Excedrin Past abortive triptans:  Sumatriptan, rizatriptan Past abortive ergotamine:  none Past muscle relaxants:  none Past anti-emetic:  Zofran  ODT 4mg  Past antihypertensive medications:  none Past antidepressant medications:  none Past anticonvulsant medications:  topiramate  100mg  twice daily Past anti-CGRP:  none Past vitamins/Herbal/Supplements:  none Other past therapies:  none     Family history of headache:  Mom (migraines); maternal uncle (migraines as young adult).  Of note, father passed away due to dementia (vascular and Alzheimer's)  PAST MEDICAL HISTORY: Past Medical History:  Diagnosis Date   Basal cell carcinoma 03/29/2017   Right anterior shin. Superficial.   Dysplastic nevus 03/29/2017   Right superior shoulder. Severe atypia and halo effect, edges free. Excised 05/17/2017, margins free.   Headache    Hypertension    Migraines    Skin cancer     MEDICATIONS: Current Outpatient Medications on File Prior to Visit  Medication Sig Dispense Refill   buPROPion  (WELLBUTRIN  XL) 150 MG 24 hr tablet TAKE ONE TABLET BY MOUTH DAILY 30 tablet 1   cetirizine (ZYRTEC) 10 MG tablet Take 10 mg by mouth daily. PRN     dicyclomine  (BENTYL ) 10 MG capsule TAKE ONE CAPSULE BY MOUTH THREE TIMES A DAY BEFORE MEALS AS NEEDED FOR IBS SYMPTOMS 30 capsule 0   Galcanezumab -gnlm (EMGALITY ) 120 MG/ML SOAJ INJECT 1 PEN UNDER THE SKIN ONCE MONTHLY 1 mL 11   omeprazole  (PRILOSEC) 20 MG capsule Take 1 capsule (20 mg total) by mouth daily. 90 capsule  0   Semaglutide-Weight Management (WEGOVY) 1 MG/0.5ML SOAJ Inject 1 mg into the skin.     zolmitriptan  (ZOMIG -ZMT) 5 MG disintegrating tablet Take 1 tablet earliest onset of migraine.  May repeat in 2 hours if needed. Maximum 2 tablets in 24 hours 9 tablet 11   No current facility-administered medications on file prior to visit.     ALLERGIES: Allergies  Allergen Reactions   Augmentin [Amoxicillin-Pot Clavulanate]     Upset stomach   Erythromycin Other (See Comments)    Upset stomach   Omnicef [Cefdinir]     Upset stomach    Zithromax [Azithromycin]     FAMILY HISTORY: Family History  Problem Relation Age of Onset   Migraines Mother    Stroke Mother    Aneurysm Mother    Heart attack Father    Hypertension Father    Renal cancer Father    Alzheimer's disease Father    Alzheimer's disease Maternal Grandfather    Alzheimer's disease Paternal Grandfather    Migraines Paternal Uncle       Objective:  *** General: No acute distress.  Patient appears well-groomed.   Head:  Normocephalic/atraumatic Neck:  Supple.  No paraspinal tenderness.  Full range of motion. Heart:  Regular rate and rhythm. Neuro:  Alert and oriented.  Speech fluent and not dysarthric.  Language intact.  CN II-XII intact.  Bulk and tone normal.  Muscle strength 5/5 throughout.  Sensation to light touch intact.  Deep tendon reflexes 2+ throughout, toes downgoing.  Gait normal.  Romberg negative.    Juliene Dunnings, DO  CC: Lavenia Beaver, MD

## 2023-09-12 ENCOUNTER — Ambulatory Visit: Payer: Self-pay | Admitting: Neurology

## 2023-09-19 NOTE — Progress Notes (Unsigned)
 NEUROLOGY FOLLOW UP OFFICE NOTE  Maria Mejia [id number]  Assessment/Plan:   Migraine without aura, without status migrainosus, not intractable New onset occipital headache- unclear etiology. May be related to the supplemental electrolytes as they started after she started using it.  May be cervicogenic but denies neck pain.  She is concerned as her mother had a ruptured cerebral aneurysm.      1.  Migraine prevention:  Emgality   2.  Migraine rescue:  Zomig -ZMT 5mg   3.  Limit use of pain relievers to no more than 2 days out of week to prevent risk of rebound or medication-overuse headache. 4.  Keep headache diary 5.  She will see if the headaches improve since discontinuing the electrolyte packets.  If no improvement in 2 weeks, she will contact us  and we can check CTA of head. 6.  Follow up one year (sooner if headaches do not improve)   Subjective:  Maria Mejia is a 53 year old female with HTN who follows up for migraine.  MRI of brain personally reviewed.   UPDATE: Migraines continue to be well-controlled. Intensity:  Moderate to severe Duration:  No more than an hour with Zomig  Frequency:  1 in last year   Last year, she reported a dull aching headache a couple of weeks ago.  She has been having low GFR (55-58) and was told to increase water intake with extra electrolytes.  She thinks it may be related.  It is a dull occipital ache but no neck pain,scalp allodynia or aggravated by neck movements.  No numbness or pain in extremities.  Lasts throughout day.  Headaches subsequently subsided.  Early this year, she reported random throbbing sensation ***.  Repeat MRI of brain with and without contrast showed few nonspecific white matter hyperintensities slightly progressed since 2017 but otherwise unremarkable.    Rescue protocol:  Zomig  for migraine; Zyrtec or Cold and Sinus medication for sinus headache Current NSAIDS:  ibuprofen, acetaminophen Current analgesics:  none Current  triptans:  Zomig -ZMT 5mg  Current ergotamine:  none Current anti-emetic:  none Current muscle relaxants:  none Current anti-anxiolytic:  none Current sleep aide:  none Current Antihypertensive medications:  losartan  Current Antidepressant medications:  Wellbutrin  XL Current Anticonvulsant medications:  none Current anti-CGRP:  Emgality  Current Vitamins/Herbal/Supplements:  none Current Antihistamines/Decongestants:  Zyrtec Other therapy:  none Hormone/birth control:  Lo Loestrin Fe    Caffeine:  Rarely drinks coffee.  Drinks 1 energy drink a day. Diet:  At least 1/2 gallon water daily. Exercise:  yes Depression:  mild; Anxiety:  no Other pain:  no Sleep hygiene:  Interrupted.   HISTORY:  Onset:  Adolescence.  A year and a half ago, she started cosmetic Botox around the eyes that was effective for her migraines and was able to get off of topiramate .  She hasn't received cosmetic botox since prior to Covid-19 pandemic started. Location:  Right retro-orbital Quality:  pressure Initial intensity:   Moderate to severe.  She denies new headache, thunderclap headache Aura:  She once had an ocular migraine presenting as visual aura with flashing lights lasting 30 minutes.  No associated headache. Premonitory Phase:  no Postdrome:  Migraine hangover for 2 hours Associated symptoms:  Photophobia, phonophobia.  She rarely has nausea and vomiting.  She denies associated osmophobia or unilateral numbness or weakness. Initial duration:  If treated quickly, it lasts about an hour Initial frequency:  At least 9 migraines a month.  She reports more mild sinus headaches as well (total 15 headache  days a month).  Initial frequency of abortive medication: varies Triggers:  Change in barometric pressure, artificial sweetener, peanut oil, caffeine withdrawal, menstrual cycle Relieving factors:  Caffeine only if with caffeine withdrawal  Activity:  Aggravates but able to function   She has family  history of cerebral aneurysm.  Mother passed away from aneurysm rupture in 2001.  Her mom's brother and sister have aneurysms as well.     08/20/2015 MRI Brain with and without:  2 punctate nonenhancing nonspecific hyperintense foci in the right frontal white matter 09/18/2015 CTA Head:  No aneurysm or other vascular abnormality. 07/20/2020 MRA Head:  normal without evidence of aneurysm.   Past NSAIDS:  Ibuprofen, naproxen Past analgesics:  Tylenol, Excedrin Past abortive triptans:  Sumatriptan, rizatriptan Past abortive ergotamine:  none Past muscle relaxants:  none Past anti-emetic:  Zofran  ODT 4mg  Past antihypertensive medications:  none Past antidepressant medications:  none Past anticonvulsant medications:  topiramate  100mg  twice daily Past anti-CGRP:  none Past vitamins/Herbal/Supplements:  none Other past therapies:  none     Family history of headache:  Mom (migraines); maternal uncle (migraines as young adult).  Of note, father passed away due to dementia (vascular and Alzheimer's)  PAST MEDICAL HISTORY: Past Medical History:  Diagnosis Date   Basal cell carcinoma 03/29/2017   Right anterior shin. Superficial.   Dysplastic nevus 03/29/2017   Right superior shoulder. Severe atypia and halo effect, edges free. Excised 05/17/2017, margins free.   Headache    Hypertension    Migraines    Skin cancer     MEDICATIONS: Current Outpatient Medications on File Prior to Visit  Medication Sig Dispense Refill   buPROPion  (WELLBUTRIN  XL) 150 MG 24 hr tablet TAKE ONE TABLET BY MOUTH DAILY 30 tablet 1   cetirizine (ZYRTEC) 10 MG tablet Take 10 mg by mouth daily. PRN     dicyclomine  (BENTYL ) 10 MG capsule TAKE ONE CAPSULE BY MOUTH THREE TIMES A DAY BEFORE MEALS AS NEEDED FOR IBS SYMPTOMS 30 capsule 0   Galcanezumab -gnlm (EMGALITY ) 120 MG/ML SOAJ INJECT 1 PEN UNDER THE SKIN ONCE MONTHLY 1 mL 11   omeprazole  (PRILOSEC) 20 MG capsule Take 1 capsule (20 mg total) by mouth daily. 90 capsule  0   Semaglutide-Weight Management (WEGOVY) 1 MG/0.5ML SOAJ Inject 1 mg into the skin.     zolmitriptan  (ZOMIG -ZMT) 5 MG disintegrating tablet Take 1 tablet earliest onset of migraine.  May repeat in 2 hours if needed. Maximum 2 tablets in 24 hours 9 tablet 11   No current facility-administered medications on file prior to visit.     ALLERGIES: Allergies  Allergen Reactions   Augmentin [Amoxicillin-Pot Clavulanate]     Upset stomach   Erythromycin Other (See Comments)    Upset stomach   Omnicef [Cefdinir]     Upset stomach    Zithromax [Azithromycin]     FAMILY HISTORY: Family History  Problem Relation Age of Onset   Migraines Mother    Stroke Mother    Aneurysm Mother    Heart attack Father    Hypertension Father    Renal cancer Father    Alzheimer's disease Father    Alzheimer's disease Maternal Grandfather    Alzheimer's disease Paternal Grandfather    Migraines Paternal Uncle       Objective:  *** General: No acute distress.  Patient appears well-groomed.   Head:  Normocephalic/atraumatic Neck:  Supple.  No paraspinal tenderness.  Full range of motion. Heart:  Regular rate and rhythm. Neuro:  Alert and oriented.  Speech fluent and not dysarthric.  Language intact.  CN II-XII intact.  Bulk and tone normal.  Muscle strength 5/5 throughout.  Sensation to light touch intact.  Deep tendon reflexes 2+ throughout, toes downgoing.  Gait normal.  Romberg negative.    Juliene Dunnings, DO  CC: Lavenia Beaver, MD

## 2023-09-20 ENCOUNTER — Other Ambulatory Visit: Payer: Self-pay | Admitting: Neurology

## 2023-09-20 ENCOUNTER — Ambulatory Visit (INDEPENDENT_AMBULATORY_CARE_PROVIDER_SITE_OTHER): Admitting: Neurology

## 2023-09-20 ENCOUNTER — Encounter: Payer: Self-pay | Admitting: Neurology

## 2023-09-20 DIAGNOSIS — G43701 Chronic migraine without aura, not intractable, with status migrainosus: Secondary | ICD-10-CM

## 2023-09-20 MED ORDER — ZOLMITRIPTAN 5 MG PO TBDP: ORAL_TABLET | ORAL | 11 refills | Status: AC

## 2023-09-20 MED ORDER — EMGALITY 120 MG/ML ~~LOC~~ SOAJ: SUBCUTANEOUS | 11 refills | Status: AC

## 2023-09-20 NOTE — Patient Instructions (Signed)
 Emgality  Zomig 

## 2023-10-27 ENCOUNTER — Encounter (INDEPENDENT_AMBULATORY_CARE_PROVIDER_SITE_OTHER)

## 2023-10-27 ENCOUNTER — Ambulatory Visit (INDEPENDENT_AMBULATORY_CARE_PROVIDER_SITE_OTHER): Admitting: Vascular Surgery

## 2023-11-15 ENCOUNTER — Encounter: Payer: Self-pay | Admitting: Gastroenterology

## 2023-11-16 ENCOUNTER — Encounter: Payer: Self-pay | Admitting: Gastroenterology

## 2023-11-16 NOTE — Anesthesia Preprocedure Evaluation (Addendum)
 Anesthesia Evaluation  Patient identified by MRN, date of birth, ID band Patient awake    Reviewed: Allergy & Precautions, H&P , NPO status , Patient's Chart, lab work & pertinent test results  History of Anesthesia Complications (+) history of anesthetic complications  Airway Mallampati: I  TM Distance: >3 FB Neck ROM: Full    Dental no notable dental hx.    Pulmonary neg pulmonary ROS   Pulmonary exam normal breath sounds clear to auscultation       Cardiovascular negative cardio ROS Normal cardiovascular exam Rhythm:Regular Rate:Normal     Neuro/Psych  Headaches PSYCHIATRIC DISORDERS Anxiety Depression     Neuromuscular disease negative neurological ROS  negative psych ROS   GI/Hepatic negative GI ROS, Neg liver ROS,GERD  ,,  Endo/Other  negative endocrine ROS    Renal/GU Renal diseasenegative Renal ROS  negative genitourinary   Musculoskeletal negative musculoskeletal ROS (+)    Abdominal   Peds negative pediatric ROS (+)  Hematology negative hematology ROS (+)   Anesthesia Other Findings Headache  Migraines Skin cancer  Basal cell carcinoma Dysplastic nevus  Complication of anesthesia--panic on awakening, could not open her eyes Had exertional asthma when younger, rarely gets breathing problems with seasonal allergies, doing well, not on any inhalers GERD (gastroesophageal reflux disease)  Depression Chronic migraine without aura or status migrainosus  Lymphedema IBS History of panic attack/asthma when waking from anesthesia  Stage 3a chronic kidney disease (CKD)  Polymyalgia  Raynaud's disease without gangrene Environmental and seasonal allergies     Reproductive/Obstetrics negative OB ROS                              Anesthesia Physical Anesthesia Plan  ASA: 2  Anesthesia Plan: General   Post-op Pain Management:    Induction: Intravenous  PONV Risk Score and  Plan:   Airway Management Planned: Natural Airway and Nasal Cannula  Additional Equipment:   Intra-op Plan:   Post-operative Plan:   Informed Consent: I have reviewed the patients History and Physical, chart, labs and discussed the procedure including the risks, benefits and alternatives for the proposed anesthesia with the patient or authorized representative who has indicated his/her understanding and acceptance.     Dental Advisory Given  Plan Discussed with: Anesthesiologist, CRNA and Surgeon  Anesthesia Plan Comments: (Patient consented for risks of anesthesia including but not limited to:  - adverse reactions to medications - risk of airway placement if required - damage to eyes, teeth, lips or other oral mucosa - nerve damage due to positioning  - sore throat or hoarseness - Damage to heart, brain, nerves, lungs, other parts of body or loss of life  Patient voiced understanding and assent.)        Anesthesia Quick Evaluation

## 2023-11-20 ENCOUNTER — Other Ambulatory Visit: Payer: Self-pay

## 2023-11-20 ENCOUNTER — Encounter
Admission: RE | Disposition: A | Discharge status: Home / Self Care | Payer: Self-pay | Source: Home / Self Care | Attending: Gastroenterology

## 2023-11-20 ENCOUNTER — Encounter: Payer: Self-pay | Admitting: Gastroenterology

## 2023-11-20 ENCOUNTER — Ambulatory Visit: Payer: Self-pay | Admitting: Anesthesiology

## 2023-11-20 ENCOUNTER — Ambulatory Visit
Admission: RE | Admit: 2023-11-20 | Discharge: 2023-11-20 | Disposition: A | Discharge status: Home / Self Care | Attending: Gastroenterology | Admitting: Gastroenterology

## 2023-11-20 DIAGNOSIS — R194 Change in bowel habit: Secondary | ICD-10-CM | POA: Diagnosis present

## 2023-11-20 DIAGNOSIS — G709 Myoneural disorder, unspecified: Secondary | ICD-10-CM | POA: Insufficient documentation

## 2023-11-20 DIAGNOSIS — R140 Abdominal distension (gaseous): Secondary | ICD-10-CM | POA: Insufficient documentation

## 2023-11-20 DIAGNOSIS — N1831 Chronic kidney disease, stage 3a: Secondary | ICD-10-CM | POA: Insufficient documentation

## 2023-11-20 DIAGNOSIS — F418 Other specified anxiety disorders: Secondary | ICD-10-CM | POA: Diagnosis not present

## 2023-11-20 DIAGNOSIS — K219 Gastro-esophageal reflux disease without esophagitis: Secondary | ICD-10-CM | POA: Diagnosis not present

## 2023-11-20 HISTORY — DX: Gastro-esophageal reflux disease without esophagitis: K21.9

## 2023-11-20 HISTORY — DX: Other allergic rhinitis: J30.89

## 2023-11-20 HISTORY — PX: COLONOSCOPY: SHX5424

## 2023-11-20 HISTORY — DX: Polymyalgia rheumatica: M35.3

## 2023-11-20 HISTORY — DX: Personal history of other mental and behavioral disorders: Z86.59

## 2023-11-20 HISTORY — DX: Depression, unspecified: F32.A

## 2023-11-20 HISTORY — DX: Lymphedema, not elsewhere classified: I89.0

## 2023-11-20 HISTORY — DX: Chronic migraine without aura, not intractable, without status migrainosus: G43.709

## 2023-11-20 HISTORY — DX: Other complications of anesthesia, initial encounter: T88.59XA

## 2023-11-20 HISTORY — DX: Irritable bowel syndrome, unspecified: K58.9

## 2023-11-20 HISTORY — DX: Raynaud's syndrome without gangrene: I73.00

## 2023-11-20 HISTORY — DX: Chronic kidney disease, stage 3a: N18.31

## 2023-11-20 SURGERY — COLONOSCOPY
Anesthesia: General | Site: Rectum

## 2023-11-20 MED ORDER — ONDANSETRON HCL 4 MG/2ML IJ SOLN
INTRAMUSCULAR | Status: AC
  Filled 2023-11-20: qty 6

## 2023-11-20 MED ORDER — LIDOCAINE HCL (CARDIAC) PF 100 MG/5ML IV SOSY
PREFILLED_SYRINGE | INTRAVENOUS | Status: DC | PRN
  Administered 2023-11-20: 60 mg via INTRAVENOUS

## 2023-11-20 MED ORDER — LIDOCAINE HCL (PF) 2 % IJ SOLN
INTRAMUSCULAR | Status: AC
  Filled 2023-11-20: qty 15

## 2023-11-20 MED ORDER — LACTATED RINGERS IV SOLN: INTRAVENOUS | Status: DC

## 2023-11-20 MED ORDER — PROPOFOL 10 MG/ML IV BOLUS
INTRAVENOUS | Status: DC | PRN
  Administered 2023-11-20 (×2): 50 mg via INTRAVENOUS
  Administered 2023-11-20: 20 mg via INTRAVENOUS
  Administered 2023-11-20: 150 ug/kg/min via INTRAVENOUS

## 2023-11-20 MED ORDER — PROPOFOL 1000 MG/100ML IV EMUL
INTRAVENOUS | Status: AC
  Filled 2023-11-20: qty 200

## 2023-11-20 MED ORDER — STERILE WATER FOR IRRIGATION IR SOLN
Status: DC | PRN
  Administered 2023-11-20: 500 mL

## 2023-11-20 MED ORDER — ONDANSETRON HCL 4 MG/2ML IJ SOLN
INTRAMUSCULAR | Status: DC | PRN
  Administered 2023-11-20: 4 mg via INTRAVENOUS

## 2023-11-20 MED ORDER — PROPOFOL 10 MG/ML IV BOLUS
INTRAVENOUS | Status: AC
  Filled 2023-11-20: qty 20

## 2023-11-20 SURGICAL SUPPLY — 16 items

## 2023-11-20 NOTE — Anesthesia Postprocedure Evaluation (Signed)
 Anesthesia Post Note  Patient: Trista Ciocca  Procedure(s) Performed: COLONOSCOPY (Rectum)  Patient location during evaluation: PACU Anesthesia Type: General Level of consciousness: awake and alert Pain management: pain level controlled Vital Signs Assessment: post-procedure vital signs reviewed and stable Respiratory status: spontaneous breathing, nonlabored ventilation, respiratory function stable and patient connected to nasal cannula oxygen Cardiovascular status: blood pressure returned to baseline and stable Postop Assessment: no apparent nausea or vomiting Anesthetic complications: no   No notable events documented.   Last Vitals:  Vitals:   11/20/23 0823 11/20/23 0830  BP: 113/60 106/67  Pulse: 85 76  Resp: 20 20  Temp:  36.7 C  SpO2: 100% 99%    Last Pain:  Vitals:   11/20/23 0830  TempSrc:   PainSc: 0-No pain                 Sherald Balbuena C Mervin Ramires

## 2023-11-20 NOTE — H&P (Signed)
 [name], MD [facility], DHIP [address]  [place], [place] [address]  Main: [phone] Fax:  [phone] Pager: [phone]   Primary Care Physician:  Sherial Bail, MD Primary Gastroenterologist:  Dr. [name]  Pre-Procedure History & Physical: HPI:  Maria Mejia is a 53 y.o. female is here for an colonoscopy.   Past Medical History:  Diagnosis Date   Basal cell carcinoma 03/29/2017   Right anterior shin. Superficial.   Chronic migraine without aura or status migrainosus    Complication of anesthesia    PANIC ATTACK WAKING UP YEARS AGO   Depression    Dysplastic nevus 03/29/2017   Right superior shoulder. Severe atypia and halo effect, edges free. Excised 05/17/2017, margins free.   Environmental and seasonal allergies    GERD (gastroesophageal reflux disease)    Headache    History of panic attacks    IBS (irritable bowel syndrome)    Lymphedema    Migraines    Polymyalgia    Raynaud's disease without gangrene    Skin cancer    Stage 3a chronic kidney disease (CKD) (HCC)     Past Surgical History:  Procedure Laterality Date   ARTHOSCOPIC ROTAOR CUFF REPAIR Left    BREAST REDUCTION SURGERY  2009   NASAL SEPTUM SURGERY      Prior to Admission medications   Medication Sig Start Date End Date Taking? Authorizing Provider  Amino Acids (AMINO ACID PROTEIN PO) Take 1 Scoop by mouth daily.   Yes [provider]  buPROPion  (WELLBUTRIN  XL) 150 MG 24 hr tablet TAKE ONE TABLET BY MOUTH DAILY 02/21/20  Yes Clark, Katherine K, NP  cetirizine (ZYRTEC) 10 MG tablet Take 10 mg by mouth daily. PRN Patient taking differently: Take 10 mg by mouth as needed. PRN   Yes [provider]  Creatinine POWD 1 Scoop by Does not apply route daily.   Yes [provider]  dicyclomine  (BENTYL ) 10 MG capsule TAKE ONE CAPSULE BY MOUTH THREE TIMES A DAY BEFORE MEALS AS NEEDED FOR IBS SYMPTOMS Patient taking differently:  Take 10 mg by mouth as needed. TAKE ONE CAPSULE BY MOUTH THREE TIMES A DAY BEFORE MEALS AS NEEDED FOR IBS SYMPTOMS 05/10/19  Yes Clark, Katherine K, NP  estradiol (VIVELLE-DOT) 0.05 MG/24HR patch Place 1 patch onto the skin 2 (two) times a week. 08/01/23  Yes [provider]  Galcanezumab -gnlm (EMGALITY ) 120 MG/ML SOAJ INJECT 1 PEN UNDER THE SKIN ONCE MONTHLY 09/20/23  Yes Skeet, Adam R, DO  omeprazole  (PRILOSEC) 20 MG capsule Take 1 capsule (20 mg total) by mouth daily. Patient taking differently: Take 20 mg by mouth as needed. 11/24/17  Yes Clark, Katherine K, NP  progesterone (PROMETRIUM) 100 MG capsule Take 100 mg by mouth daily.   Yes [provider]  zolmitriptan  (ZOMIG -ZMT) 5 MG disintegrating tablet Take 1 tablet earliest onset of migraine.  May repeat in 2 hours if needed. Maximum 2 tablets in 24 hours 09/20/23  Yes Jaffe, Adam R, DO  Semaglutide-Weight Management (WEGOVY) 1 MG/0.5ML SOAJ Inject 1 mg into the skin. 06/05/23   [provider]    Allergies as of 09/19/2023 - Review Complete 08/01/2023  Allergen Reaction Noted   Augmentin [amoxicillin-pot clavulanate]  08/07/2015   Erythromycin Other (See Comments) 08/07/2015   Omnicef [cefdinir]  08/07/2015   Zithromax [azithromycin]  08/07/2015    Family History  Problem Relation Age of Onset   Migraines Mother    Stroke Mother  Aneurysm Mother    Heart attack Father    Hypertension Father    Renal cancer Father    Alzheimer's disease Father    Alzheimer's disease Maternal Grandfather    Alzheimer's disease Paternal Grandfather    Migraines Paternal Uncle     Social History   Socioeconomic History   Marital status: Divorced    Spouse name: Not on file   Number of children: 0   Years of education: Not on file   Highest education level: Not on file  Occupational History   Not on file  Tobacco Use   Smoking status: Never   Smokeless tobacco: Never  Vaping Use   Vaping status: Never Used   Substance and Sexual Activity   Alcohol use: Yes    Alcohol/week: 1.0 standard drink of alcohol    Types: 1 Shots of liquor per week    Comment: occcasioally   Drug use: Yes    Types: Marijuana    Comment: AS TEENAGER   Sexual activity: Yes    Partners: Male    Birth control/protection: Pill  Other Topics Concern   Not on file  Social History Narrative   Right handed       HS graduate      Two story home   Little caffeine         Social Drivers of Health   Financial Resource Strain: Low Risk  (09/19/2023)   Received from [facility]   Overall Financial Resource Strain (CARDIA)    Difficulty of Paying Living Expenses: Not hard at all  Food Insecurity: No Food Insecurity (09/19/2023)   Received from [facility]   Hunger Vital Sign    Within the past 12 months, you worried that your food would run out before you got the money to buy more.: Never true    Within the past 12 months, the food you bought just didn't last and you didn't have money to get more.: Never true  Transportation Needs: No Transportation Needs (09/19/2023)   Received from [facility] - Transportation    In the past 12 months, has lack of transportation kept you from medical appointments or from getting medications?: No    Lack of Transportation (Non-Medical): No  Physical Activity: Not on file  Stress: Not on file  Social Connections: Not on file  Intimate Partner Violence: Not on file    Review of Systems: See HPI, otherwise negative ROS  Physical Exam: BP 134/81   Pulse 84   Temp 99 F (37.2 C) (Temporal)   Ht 5' 3 (1.6 m)   Wt 66.2 kg   SpO2 99%   BMI 25.86 kg/m  General:   Alert,  pleasant and cooperative in NAD Head:  Normocephalic and atraumatic. Neck:  Supple; no masses or thyromegaly. Lungs:  Clear throughout to auscultation.    Heart:  Regular rate and rhythm. Abdomen:  Soft, nontender and nondistended. Normal bowel  sounds, without guarding, and without rebound.   Neurologic:  Alert and  oriented x4;  grossly normal neurologically.  Impression/Plan: Maria Mejia is here for an colonoscopy to be performed for abdominal bloating, change in bowel habits  Risks, benefits, limitations, and alternatives regarding  colonoscopy have been reviewed with the patient.  Questions have been answered.  All parties agreeable.   Corinn Brooklyn, MD  11/20/2023, 7:41 AM

## 2023-11-20 NOTE — Op Note (Signed)
 [facility] Patient Name: Maria Mejia Procedure Date: 11/20/2023 7:07 AM MRN: [id number] Account #: [id number] Date of Birth: 12-13-1970 Admit Type: Outpatient Age: 53 Room: [facility] OR ROOM 01 Gender: Female Note Status: Finalized Instrument Name: Colonoscope [id number] Procedure:             Colonoscopy Indications:           This is the patient's first colonoscopy, , Change in                         bowel habits, abdominal bloating Providers:             Corinn Jess Brooklyn MD, MD Referring MD:          Lavenia Beaver, MD (Referring MD) Medicines:             General Anesthesia Complications:         No immediate complications. Estimated blood loss: None. Procedure:             Pre-Anesthesia Assessment:                        - Prior to the procedure, a History and Physical was                         performed, and patient medications and allergies were                         reviewed. The patient is competent. The risks and                         benefits of the procedure and the sedation options and                         risks were discussed with the patient. All questions                         were answered and informed consent was obtained.                         Patient identification and proposed procedure were                         verified by the physician, the nurse, the                         anesthesiologist, the anesthetist and the technician                         in the pre-procedure area in the procedure room in the                         endoscopy suite. Mental Status Examination: alert and                         oriented. Airway Examination: normal oropharyngeal                         airway and neck mobility. Respiratory Examination:  clear to auscultation. CV Examination: normal.                         Prophylactic Antibiotics: The patient does not require                          prophylactic antibiotics. Prior Anticoagulants: The                         patient has taken no anticoagulant or antiplatelet                         agents. ASA Grade Assessment: II - A patient with mild                         systemic disease. After reviewing the risks and                         benefits, the patient was deemed in satisfactory                         condition to undergo the procedure. The anesthesia                         plan was to use general anesthesia. Immediately prior                         to administration of medications, the patient was                         re-assessed for adequacy to receive sedatives. The                         heart rate, respiratory rate, oxygen saturations,                         blood pressure, adequacy of pulmonary ventilation, and                         response to care were monitored throughout the                         procedure. The physical status of the patient was                         re-assessed after the procedure.                        After obtaining informed consent, the colonoscope was                         passed under direct vision. Throughout the procedure,                         the patient's blood pressure, pulse, and oxygen                         saturations were monitored continuously. The  Colonoscope was introduced through the anus and                         advanced to the the cecum, identified by appendiceal                         orifice and ileocecal valve. The colonoscopy was                         performed without difficulty. The patient tolerated                         the procedure well. The quality of the bowel                         preparation was evaluated using the BBPS [place] Bowel                         Preparation Scale) with scores of: Right Colon = 3,                         Transverse Colon = 3 and Left Colon = 3 (entire mucosa                          seen well with no residual staining, small fragments                         of stool or opaque liquid). The total BBPS score                         equals 9. The terminal ileum, ileocecal valve,                         appendiceal orifice, and rectum were photographed. Findings:      The perianal and digital rectal examinations were normal. Pertinent       negatives include normal sphincter tone and no palpable rectal lesions.      The terminal ileum appeared normal.      The entire examined colon appeared normal.      The retroflexed view of the distal rectum and anal verge was normal and       showed no anal or rectal abnormalities. Impression:            - The examined portion of the ileum was normal.                        - The entire examined colon is normal.                        - The distal rectum and anal verge are normal on                         retroflexion view.                        - No specimens collected. Recommendation:        - Discharge patient to home (with escort).                        -  Resume previous diet today.                        - Continue present medications.                        - Repeat colonoscopy in 10 years for screening                         purposes. Procedure Code(s):     --- Professional ---                        [phone], Colonoscopy, flexible; diagnostic, including                         collection of specimen(s) by brushing or washing, when                         performed (separate procedure) Diagnosis Code(s):     --- Professional ---                        R19.4, Change in bowel habit CPT copyright 2022 American Medical Association. All rights reserved. The codes documented in this report are preliminary and upon coder review may  be revised to meet current compliance requirements. Dr. Corinn Brooklyn Corinn Jess Brooklyn MD, MD 11/20/2023 8:22:16 AM This report has been signed electronically. Number of Addenda: 0 Note  Initiated On: 11/20/2023 7:07 AM Scope Withdrawal Time: 0 hours 6 minutes 27 seconds  Total Procedure Duration: 0 hours 11 minutes 31 seconds  Estimated Blood Loss:  Estimated blood loss: none.      [facility]

## 2023-11-20 NOTE — Transfer of Care (Signed)
 Immediate Anesthesia Transfer of Care Note  Patient: Maria Mejia  Procedure(s) Performed: COLONOSCOPY (Rectum)  Patient Location: PACU  Anesthesia Type: General  Level of Consciousness: awake, alert  and patient cooperative  Airway and Oxygen Therapy: Patient Spontanous Breathing and Patient connected to supplemental oxygen  Post-op Assessment: Post-op Vital signs reviewed, Patient's Cardiovascular Status Stable, Respiratory Function Stable, Patent Airway and No signs of Nausea or vomiting  Post-op Vital Signs: Reviewed and stable  Complications: No notable events documented.

## 2023-11-21 ENCOUNTER — Ambulatory Visit: Admitting: Dermatology

## 2023-11-21 ENCOUNTER — Encounter: Payer: Self-pay | Admitting: Dermatology

## 2023-11-21 DIAGNOSIS — L814 Other melanin hyperpigmentation: Secondary | ICD-10-CM | POA: Diagnosis not present

## 2023-11-21 DIAGNOSIS — D1801 Hemangioma of skin and subcutaneous tissue: Secondary | ICD-10-CM

## 2023-11-21 DIAGNOSIS — D2372 Other benign neoplasm of skin of left lower limb, including hip: Secondary | ICD-10-CM

## 2023-11-21 DIAGNOSIS — Z85828 Personal history of other malignant neoplasm of skin: Secondary | ICD-10-CM

## 2023-11-21 DIAGNOSIS — Z1283 Encounter for screening for malignant neoplasm of skin: Secondary | ICD-10-CM

## 2023-11-21 DIAGNOSIS — Z86018 Personal history of other benign neoplasm: Secondary | ICD-10-CM

## 2023-11-21 DIAGNOSIS — D2262 Melanocytic nevi of left upper limb, including shoulder: Secondary | ICD-10-CM

## 2023-11-21 DIAGNOSIS — W908XXA Exposure to other nonionizing radiation, initial encounter: Secondary | ICD-10-CM

## 2023-11-21 DIAGNOSIS — D239 Other benign neoplasm of skin, unspecified: Secondary | ICD-10-CM

## 2023-11-21 DIAGNOSIS — L578 Other skin changes due to chronic exposure to nonionizing radiation: Secondary | ICD-10-CM | POA: Diagnosis not present

## 2023-11-21 DIAGNOSIS — D229 Melanocytic nevi, unspecified: Secondary | ICD-10-CM

## 2023-11-21 DIAGNOSIS — D225 Melanocytic nevi of trunk: Secondary | ICD-10-CM

## 2023-11-21 DIAGNOSIS — D2371 Other benign neoplasm of skin of right lower limb, including hip: Secondary | ICD-10-CM

## 2023-11-21 DIAGNOSIS — L720 Epidermal cyst: Secondary | ICD-10-CM

## 2023-11-21 DIAGNOSIS — L910 Hypertrophic scar: Secondary | ICD-10-CM

## 2023-11-21 DIAGNOSIS — L918 Other hypertrophic disorders of the skin: Secondary | ICD-10-CM

## 2023-11-21 DIAGNOSIS — L821 Other seborrheic keratosis: Secondary | ICD-10-CM

## 2023-11-21 DIAGNOSIS — D224 Melanocytic nevi of scalp and neck: Secondary | ICD-10-CM

## 2023-11-21 DIAGNOSIS — L729 Follicular cyst of the skin and subcutaneous tissue, unspecified: Secondary | ICD-10-CM

## 2023-11-21 NOTE — Patient Instructions (Addendum)
 Recommend Serica moisturizing scar formula cream every night or [facility] brand or Mederma silicone scar sheet every night for the first year after a scar appears to help with scar remodeling if desired. Scars remodel on their own for a full year and will gradually improve in appearance over time.   Melanoma ABCDEs  Melanoma is the most dangerous type of skin cancer, and is the leading cause of death from skin disease.  You are more likely to develop melanoma if you: Have light-colored skin, light-colored eyes, or red or blond hair Spend a lot of time in the sun Tan regularly, either outdoors or in a tanning bed Have had blistering sunburns, especially during childhood Have a close family member who has had a melanoma Have atypical moles or large birthmarks  Early detection of melanoma is key since treatment is typically straightforward and cure rates are extremely high if we catch it early.   The first sign of melanoma is often a change in a mole or a new dark spot.  The ABCDE system is a way of remembering the signs of melanoma.  A for asymmetry:  The two halves do not match. B for border:  The edges of the growth are irregular. C for color:  A mixture of colors are present instead of an even [place] color. D for diameter:  Melanomas are usually (but not always) greater than 6mm - the size of a pencil eraser. E for evolution:  The spot keeps changing in size, shape, and color.  Please check your skin once per month between visits. You can use a small mirror in front and a large mirror behind you to keep an eye on the back side or your body.   If you see any new or changing lesions before your next follow-up, please call to schedule a visit.  Please continue daily skin protection including broad spectrum sunscreen SPF 30+ to sun-exposed areas, reapplying every 2 hours as needed when you're outdoors.   Staying in the shade or wearing long sleeves, sun glasses (UVA+UVB protection) and wide  brim hats (4-inch brim around the entire circumference of the hat) are also recommended for sun protection.    Recommend daily broad spectrum sunscreen SPF 30+ to sun-exposed areas, reapply every 2 hours as needed. Call for new or changing lesions.  Staying in the shade or wearing long sleeves, sun glasses (UVA+UVB protection) and wide brim hats (4-inch brim around the entire circumference of the hat) are also recommended for sun protection.    Due to recent changes in healthcare laws, you may see results of your pathology and/or laboratory studies on MyChart before the doctors have had a chance to review them. We understand that in some cases there may be results that are confusing or concerning to you. Please understand that not all results are received at the same time and often the doctors may need to interpret multiple results in order to provide you with the best plan of care or course of treatment. Therefore, we ask that you please give us  2 business days to thoroughly review all your results before contacting the office for clarification. Should we see a critical lab result, you will be contacted sooner.   If You Need Anything After Your Visit  If you have any questions or concerns for your doctor, please call our main line at [phone] and press option 4 to reach your doctor's medical assistant. If no one answers, please leave a voicemail as directed and we  will return your call as soon as possible. Messages left after 4 pm will be answered the following business day.   You may also send us  a message via MyChart. We typically respond to MyChart messages within 1-2 business days.  For prescription refills, please ask your pharmacy to contact our office. Our fax number is [phone].  If you have an urgent issue when the clinic is closed that cannot wait until the next business day, you can page your doctor at the number below.    Please note that while we do our best to be available  for urgent issues outside of office hours, we are not available 24/7.   If you have an urgent issue and are unable to reach us , you may choose to seek medical care at your doctor's office, retail clinic, urgent care center, or emergency room.  If you have a medical emergency, please immediately call 911 or go to the emergency department.  Pager Numbers  - Dr. Hester: [phone]  - Dr. Jackquline: [phone]  - Dr. Claudene: [phone]   - Dr. Raymund: [phone]  In the event of inclement weather, please call our main line at [phone] for an update on the status of any delays or closures.  Dermatology Medication Tips: Please keep the boxes that topical medications come in in order to help keep track of the instructions about where and how to use these. Pharmacies typically print the medication instructions only on the boxes and not directly on the medication tubes.   If your medication is too expensive, please contact our office at [phone] option 4 or send us  a message through MyChart.   We are unable to tell what your co-pay for medications will be in advance as this is different depending on your insurance coverage. However, we may be able to find a substitute medication at lower cost or fill out paperwork to get insurance to cover a needed medication.   If a prior authorization is required to get your medication covered by your insurance company, please allow us  1-2 business days to complete this process.  Drug prices often vary depending on where the prescription is filled and some pharmacies may offer cheaper prices.  The website www.goodrx.com contains coupons for medications through different pharmacies. The prices here do not account for what the cost may be with help from insurance (it may be cheaper with your insurance), but the website can give you the price if you did not use any insurance.  - You can print the associated coupon and take it with your prescription  to the pharmacy.  - You may also stop by our office during regular business hours and pick up a GoodRx coupon card.  - If you need your prescription sent electronically to a different pharmacy, notify our office through [facility] or by phone at [phone] option 4.     Si Usted Necesita Algo Despus de Su Visita  Tambin puede enviarnos un mensaje a travs de Clinical cytogeneticist. Por lo general respondemos a los mensajes de MyChart en el transcurso de 1 a 2 das hbiles.  Para renovar recetas, por favor pida a su farmacia que se ponga en contacto con nuestra oficina. Randi lakes de fax es [place] [phone].  Si tiene un asunto urgente cuando la clnica est cerrada y que no puede esperar hasta el siguiente da hbil, puede llamar/localizar a su doctor(a) al nmero que aparece a continuacin.   Por favor, tenga en cuenta que aunque hacemos todo  lo posible para estar disponibles para asuntos urgentes fuera del horario de oficina, no estamos disponibles las 24 horas del da, los 7 [address]  [address] de la [place].   Si tiene un problema urgente y no puede comunicarse con nosotros, puede optar por buscar atencin mdica  en el consultorio de su doctor(a), en una clnica privada, en un centro de atencin urgente o en una sala de emergencias.  Si tiene Engineer, drilling, por favor llame inmediatamente al 911 o vaya a la sala de emergencias.  Nmeros de bper  - Dr. Hester: [phone]  - Dra. Jackquline: [phone]  - Dr. Claudene: [phone]  - Dra. Kitts: [phone]  En caso de inclemencias del [place], por favor llame a nuestra lnea principal al [phone] para una actualizacin sobre el estado de cualquier retraso o cierre.  Consejos para la medicacin en dermatologa: Por favor, guarde las cajas en las que vienen los medicamentos de uso tpico para ayudarle a seguir las instrucciones sobre dnde y cmo usarlos. Las farmacias generalmente imprimen las instrucciones del medicamento slo en  las cajas y no directamente en los tubos del [place].   Si su medicamento es muy caro, por favor, pngase en contacto con landry rieger llamando al [phone] y presione la opcin 4 o envenos un mensaje a travs de Clinical cytogeneticist.   No podemos decirle cul ser su copago por los medicamentos por adelantado ya que esto es diferente dependiendo de la cobertura de su seguro. Sin embargo, es posible que podamos encontrar un medicamento sustituto a Audiological scientist un formulario para que el seguro cubra el medicamento que se considera necesario.   Si se requiere una autorizacin previa para que su compaa de seguros malta su medicamento, por favor permtanos de 1 a 2 das hbiles para completar este proceso.  Los precios de los medicamentos varan con frecuencia dependiendo del Environmental consultant de dnde se surte la receta y alguna farmacias pueden ofrecer precios ms baratos.  El sitio web www.goodrx.com tiene cupones para medicamentos de Health and safety inspector. Los precios aqu no tienen en cuenta lo que podra costar con la ayuda del seguro (puede ser ms barato con su seguro), pero el sitio web puede darle el precio si no utiliz Tourist information centre manager.  - Puede imprimir el cupn correspondiente y llevarlo con su receta a la farmacia.  - Tambin puede pasar por nuestra oficina durante el horario de atencin regular y Education officer, museum una tarjeta de cupones de GoodRx.  - Si necesita que su receta se enve electrnicamente a una farmacia diferente, informe a nuestra oficina a travs de MyChart de [place] o por telfono llamando al [phone] y presione la opcin 4.

## 2023-11-21 NOTE — Progress Notes (Signed)
 New Patient Visit   Subjective  Maria Mejia is a 53 y.o. female who presents for the following: Skin Cancer Screening and Full Body Skin Exam hx of BCC, Dysplastic Nevus, cyst that come and go on back, hx of cyst excised on chest form another provider, feels like it is still there, knot L medial ankle ~65yr,   The patient presents for Total-Body Skin Exam (TBSE) for skin cancer screening and mole check. The patient has spots, moles and lesions to be evaluated, some may be new or changing and the patient may have concern these could be cancer.    The following portions of the chart were reviewed this encounter and updated as appropriate: medications, allergies, medical history  Review of Systems:  No other skin or systemic complaints except as noted in HPI or Assessment and Plan.  Objective  Well appearing patient in no apparent distress; mood and affect are within normal limits.  A full examination was performed including scalp, head, eyes, ears, nose, lips, neck, chest, axillae, abdomen, back, buttocks, bilateral upper extremities, bilateral lower extremities, hands, feet, fingers, toes, fingernails, and toenails. All findings within normal limits unless otherwise noted below.   Exam of nails limited by presence of nail polish.   Relevant physical exam findings are noted in the Assessment and Plan.    Assessment & Plan   SKIN CANCER SCREENING PERFORMED TODAY.  ACTINIC DAMAGE - Chronic condition, secondary to cumulative UV/sun exposure - diffuse scaly erythematous macules with underlying dyspigmentation - Recommend daily broad spectrum sunscreen SPF 30+ to sun-exposed areas, reapply every 2 hours as needed.  - Staying in the shade or wearing long sleeves, sun glasses (UVA+UVB protection) and wide brim hats (4-inch brim around the entire circumference of the hat) are also recommended for sun protection.  - Call for new or changing lesions.  LENTIGINES, SEBORRHEIC KERATOSES,  HEMANGIOMAS - Benign normal skin lesions - Benign-appearing - Call for any changes  MELANOCYTIC NEVI - Tan-Rajkumar and/or pink-flesh-colored symmetric macules and papules - L 5th palmar finger 3 x 2 mm medium dark Fernando macule, photo compared - L abdomen 6 mm med dark Heacox macule, photo compared - R frontal scalp 8 mm flesh papule (dermal nevus) - Benign appearing on exam today. Stable - Observation - Call clinic for new or changing moles - Recommend daily use of broad spectrum spf 30+ sunscreen to sun-exposed areas.    HISTORY OF BASAL CELL CARCINOMA OF THE SKIN - No evidence of recurrence today- R ant shin 2019 - Recommend regular full body skin exams - Recommend daily broad spectrum sunscreen SPF 30+ to sun-exposed areas, reapply every 2 hours as needed.  - Call if any new or changing lesions are noted between office visits    HISTORY OF DYSPLASTIC NEVUS No evidence of recurrence today- R sup shoulder excised 05/17/17 Recommend regular full body skin exams Recommend daily broad spectrum sunscreen SPF 30+ to sun-exposed areas, reapply every 2 hours as needed.  Call if any new or changing lesions are noted between office visits    DERMATOFIBROMA L medial ankle, R calf Exam: Firm pink/Sagen papulenodule with dimple sign.  Treatment Plan: A dermatofibroma is a benign growth possibly related to trauma, such as an insect bite, cut from shaving, or inflamed acne-type bump.  Treatment options to remove include shave or excision with resulting scar and risk of recurrence.  Since benign-appearing and not bothersome, will observe for now.    EPIDERMAL INCLUSION CYST R spinal mid back Exam:  firm subcutaneous nodule at R spinal mid back 0.6cm  Benign-appearing. Exam most consistent with an epidermal inclusion cyst. Discussed that a cyst is a benign growth that can grow over time and sometimes get irritated or inflamed. Recommend observation if it is not bothersome. Discussed option of  surgical excision to remove it if it is growing, symptomatic, or other changes noted. Please call for new or changing lesions so they can be evaluated.  HYPERTROPHIC SCAR R medial breast Exam: hypertrophic scar R medial breast. S/p cyst excision Benign-appearing.  Observation.  Call clinic for new or changing lesions. Recommend daily broad spectrum sunscreen SPF 30+, reapply every 2 hours as needed. Treatment: Recommend Serica moisturizing scar formula cream every night or [facility] brand or Mederma silicone scar sheet every night for the first year after a scar appears to help with scar remodeling if desired. Scars remodel on their own for a full year and will gradually improve in appearance over time.  Acrochordons (Skin Tags) vs Sks neck - Waxy pedunculated Suchecki papules - Benign appearing.  - Observe. - If desired, they can be removed with an in office procedure that is not covered by insurance. - Please call the clinic if you notice any new or changing lesions.    Return in about 1 year (around 11/20/2024) for TBSE, Hx of BCC, Hx of Dysplastic nevi.  I, Grayce Saunas, RMA, am acting as scribe for Rexene Rattler, MD .   Documentation: I have reviewed the above documentation for accuracy and completeness, and I agree with the above.  Rexene Rattler, MD

## 2023-12-04 ENCOUNTER — Encounter: Payer: Self-pay | Admitting: Radiology

## 2023-12-25 ENCOUNTER — Ambulatory Visit: Payer: Self-pay | Admitting: Dermatology

## 2024-01-26 ENCOUNTER — Encounter: Payer: Self-pay | Admitting: Neurology

## 2024-02-06 ENCOUNTER — Other Ambulatory Visit (HOSPITAL_COMMUNITY): Payer: Self-pay

## 2024-02-08 ENCOUNTER — Telehealth: Payer: Self-pay | Admitting: Pharmacy Technician

## 2024-02-08 ENCOUNTER — Other Ambulatory Visit (HOSPITAL_COMMUNITY): Payer: Self-pay

## 2024-02-08 NOTE — Telephone Encounter (Signed)
 Pharmacy Patient Advocate Encounter   Received notification from Patient Advice Request messages that prior authorization for EMGALITY  120MG  is required/requested.   Insurance verification completed.   The patient is insured through [employer].   Per test claim: PA required; PA submitted to above mentioned insurance via Latent Key/confirmation #/[id number] Status is pending

## 2024-02-08 NOTE — Telephone Encounter (Signed)
 PA has been submitted, and telephone encounter has been created. Please see telephone encounter dated 1.8.26.

## 2024-02-20 ENCOUNTER — Other Ambulatory Visit (HOSPITAL_COMMUNITY): Payer: Self-pay

## 2024-02-20 NOTE — Telephone Encounter (Signed)
 Pharmacy Patient Advocate Encounter  Received notification from [facility] that Prior Authorization for Emgality  has been APPROVED from 01/09/2024 to 07/2024. Unable to obtain price due to refill too soon rejection, last fill date 02/15/2024 next available fill date02/07/2024   PA #/Case ID/Reference #: [id number]

## 2024-09-20 ENCOUNTER — Ambulatory Visit: Admitting: Neurology

## 2024-11-19 ENCOUNTER — Ambulatory Visit: Admitting: Dermatology
